# Patient Record
Sex: Male | Born: 1990 | Race: White | Hispanic: No | Marital: Married | Smoking: Former smoker
Health system: Southern US, Community
[De-identification: ages and names within clinical notes are randomized; demographics above are authoritative.]

## PROBLEM LIST (undated history)

## (undated) HISTORY — PX: APPENDECTOMY: SHX54

## (undated) HISTORY — PX: HAND SURGERY: SHX662

---
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
## 2008-11-21 ENCOUNTER — Ambulatory Visit (HOSPITAL_COMMUNITY): Payer: Self-pay | Admitting: Psychiatry

## 2008-12-07 ENCOUNTER — Ambulatory Visit (HOSPITAL_COMMUNITY): Payer: Self-pay | Admitting: Psychiatry

## 2009-10-21 HISTORY — PX: ANTERIOR CRUCIATE LIGAMENT REPAIR: SHX115

## 2010-01-03 ENCOUNTER — Emergency Department (HOSPITAL_COMMUNITY): Admission: EM | Admit: 2010-01-03 | Discharge: 2010-01-03 | Payer: Self-pay | Admitting: Emergency Medicine

## 2010-02-01 ENCOUNTER — Encounter: Admission: RE | Admit: 2010-02-01 | Discharge: 2010-02-15 | Payer: Self-pay | Admitting: Orthopedic Surgery

## 2010-03-05 ENCOUNTER — Emergency Department (HOSPITAL_COMMUNITY): Admission: EM | Admit: 2010-03-05 | Discharge: 2010-03-06 | Payer: Self-pay | Admitting: Emergency Medicine

## 2010-03-26 ENCOUNTER — Encounter: Admission: RE | Admit: 2010-03-26 | Discharge: 2010-05-22 | Payer: Self-pay | Admitting: Orthopedic Surgery

## 2010-05-31 ENCOUNTER — Emergency Department (HOSPITAL_COMMUNITY): Admission: EM | Admit: 2010-05-31 | Discharge: 2010-05-31 | Payer: Self-pay | Admitting: Emergency Medicine

## 2010-06-15 ENCOUNTER — Emergency Department (HOSPITAL_COMMUNITY): Admission: EM | Admit: 2010-06-15 | Discharge: 2010-06-15 | Payer: Self-pay | Admitting: Emergency Medicine

## 2010-07-28 ENCOUNTER — Emergency Department (HOSPITAL_COMMUNITY): Admission: EM | Admit: 2010-07-28 | Discharge: 2010-07-28 | Payer: Self-pay | Admitting: Emergency Medicine

## 2010-08-18 ENCOUNTER — Emergency Department (HOSPITAL_COMMUNITY): Admission: EM | Admit: 2010-08-18 | Discharge: 2010-08-18 | Payer: Self-pay | Admitting: Emergency Medicine

## 2010-12-06 ENCOUNTER — Emergency Department (HOSPITAL_COMMUNITY)
Admission: EM | Admit: 2010-12-06 | Discharge: 2010-12-06 | Disposition: A | Discharge status: Home / Self Care | Payer: Self-pay | Attending: Emergency Medicine | Admitting: Emergency Medicine

## 2010-12-06 DIAGNOSIS — H6690 Otitis media, unspecified, unspecified ear: Secondary | ICD-10-CM | POA: Insufficient documentation

## 2010-12-06 DIAGNOSIS — J45909 Unspecified asthma, uncomplicated: Secondary | ICD-10-CM | POA: Insufficient documentation

## 2010-12-06 DIAGNOSIS — H9209 Otalgia, unspecified ear: Secondary | ICD-10-CM | POA: Insufficient documentation

## 2011-02-20 ENCOUNTER — Emergency Department (HOSPITAL_COMMUNITY)
Admission: EM | Admit: 2011-02-20 | Discharge: 2011-02-20 | Disposition: A | Discharge status: Home / Self Care | Payer: Self-pay | Attending: Emergency Medicine | Admitting: Emergency Medicine

## 2011-02-20 ENCOUNTER — Emergency Department (HOSPITAL_COMMUNITY): Payer: Self-pay

## 2011-02-20 ENCOUNTER — Encounter (HOSPITAL_COMMUNITY): Payer: Self-pay | Admitting: Radiology

## 2011-02-20 DIAGNOSIS — S60229A Contusion of unspecified hand, initial encounter: Secondary | ICD-10-CM | POA: Insufficient documentation

## 2011-02-20 DIAGNOSIS — IMO0002 Reserved for concepts with insufficient information to code with codable children: Secondary | ICD-10-CM | POA: Insufficient documentation

## 2011-02-20 DIAGNOSIS — S20219A Contusion of unspecified front wall of thorax, initial encounter: Secondary | ICD-10-CM | POA: Insufficient documentation

## 2011-02-20 DIAGNOSIS — R109 Unspecified abdominal pain: Secondary | ICD-10-CM | POA: Insufficient documentation

## 2011-02-20 DIAGNOSIS — Y929 Unspecified place or not applicable: Secondary | ICD-10-CM | POA: Insufficient documentation

## 2011-02-20 DIAGNOSIS — J45909 Unspecified asthma, uncomplicated: Secondary | ICD-10-CM | POA: Insufficient documentation

## 2011-02-20 DIAGNOSIS — M79609 Pain in unspecified limb: Secondary | ICD-10-CM | POA: Insufficient documentation

## 2011-02-20 DIAGNOSIS — R079 Chest pain, unspecified: Secondary | ICD-10-CM | POA: Insufficient documentation

## 2011-02-20 DIAGNOSIS — R10812 Left upper quadrant abdominal tenderness: Secondary | ICD-10-CM | POA: Insufficient documentation

## 2011-02-20 MED ORDER — IOHEXOL 300 MG/ML  SOLN
100.0000 mL | Freq: Once | INTRAMUSCULAR | Status: AC | PRN
  Administered 2011-02-20: 100 mL via INTRAVENOUS

## 2011-03-08 NOTE — Op Note (Signed)
NAME:  Billy Duke, Billy Duke NO.:  [id number]   MEDICAL RECORD NO.:  [id number]          PATIENT TYPE:  INP   LOCATION:  1829                         FACILITY:  [facility]   PHYSICIAN:  Prabhakar D. Pendse, M.D.DATE OF BIRTH:  [birth year]   DATE OF PROCEDURE:  07/21/2004  DATE OF DISCHARGE:                                 OPERATIVE REPORT   PREOPERATIVE DIAGNOSIS:  Acute, retrocecal appendicitis.   POSTOPERATIVE DIAGNOSIS:  Acute, retrocecal appendicitis without gross  perforation.   PROCEDURE:  Exploratory laparotomy and appendectomy.   SURGEON:  Prabhakar D. Levie Heritage, M.D.   ASSISTANT:  Nurse.   ANESTHESIA:  General.   INDICATIONS FOR PROCEDURE:  This 20 year old overweight boy was seen at  [facility] with about a 19 hours' history of progressively  worse abdominal pain associated with nausea, anorexia and vomiting.  His  white count was 17,400 with 85% neutrophils.  His urinalysis showed high  proteins and specific gravity of 1.025, microscopic was negative.  He was  referred to [facility] where he underwent CT scan with oral and IV  contrast, which showed findings consistent with acute appendicitis.  Exploratory laparotomy was recommended.   OPERATIVE FINDINGS:  Exploration of the right lower quadrant area showed at  least 1-1/2 inch thickness of the fatty abdominal wall and hence, the  exposure of the cecum and appendix was difficult.  Upon opening the  peritoneal cavity, there was a moderate area of straw colored, clear fluid  without any odor. Appendix was in the retrocecal position, measuring about 3  inches long, markedly indurated, distended without any gross perforation.   During the digital exploration and to separate the appendix from the  retrocecal position, there may have been some contamination.  Limited  examination of the distal ileum showed no evidence of ileitis.  Further  exploration was deferred.   DESCRIPTION OF  PROCEDURE:  Under satisfactory general endotracheal  anesthesia, patient in supine position, abdomen was prepped and draped in  the usual manner.  About a 2 inch long transverse incision was made high in  the right lower quadrant area, skin and subcutaneous tissue incised.  Moderately thick, subcutaneous fatty tissue was encountered. Muscles incised  in the McBurney fashion.   Peritoneal cavity entered, deep retractors were placed in and exploration  revealed findings as described above. The appendiceal base was identified  and held with Babcock forceps.  From here on, most of the dissection was  done digitally, to separate the appendix from the retrocecal position. There  was a moderate amount of bleeding. Appendix was exteriorized with  difficulty.  Appendiceal mesentery clamp and hemoclips applied.  Appendiceal  base was also clamped with about 4 hemoclips, appendectomy done.  The stump  was not buried in the cecal wall, hemostasis accomplished.  Sponge and  needle counts being correct, peritoneum was closed with 2-0 Vicryl running,  interlocking sutures, individual muscles with 2-0 Vicryl interrupted  sutures, subcutaneous tissue with 2-0 Vicryl, skin with 4-0 Monocryl  subcuticular sutures. Steri-Strips applied, appropriate dressing applied.   Throughout the procedure, the patient's vital signs  remained stable. The  patient withstood the procedure well, was transferred to the recovery room  in general, satisfactory condition.       PDP/MEDQ  D:  07/21/2004  T:  07/22/2004  Job:  [id number]   cc:   Katrinka Blazing, Dr.  [facility]   Celene Kras, MD  Fax: [phone]   Maryellen Pile, M.D.

## 2011-04-02 ENCOUNTER — Emergency Department (HOSPITAL_COMMUNITY)
Admission: EM | Admit: 2011-04-02 | Discharge: 2011-04-02 | Disposition: A | Discharge status: Home / Self Care | Payer: Self-pay | Attending: Emergency Medicine | Admitting: Emergency Medicine

## 2011-04-02 DIAGNOSIS — Z0389 Encounter for observation for other suspected diseases and conditions ruled out: Secondary | ICD-10-CM | POA: Insufficient documentation

## 2011-05-06 ENCOUNTER — Emergency Department (HOSPITAL_COMMUNITY)
Admission: EM | Admit: 2011-05-06 | Discharge: 2011-05-06 | Disposition: A | Discharge status: Home / Self Care | Payer: Self-pay | Attending: Emergency Medicine | Admitting: Emergency Medicine

## 2011-05-06 ENCOUNTER — Emergency Department (HOSPITAL_COMMUNITY): Payer: Self-pay

## 2011-05-06 DIAGNOSIS — M25569 Pain in unspecified knee: Secondary | ICD-10-CM | POA: Insufficient documentation

## 2011-05-06 DIAGNOSIS — J45909 Unspecified asthma, uncomplicated: Secondary | ICD-10-CM | POA: Insufficient documentation

## 2011-05-06 DIAGNOSIS — Y9229 Other specified public building as the place of occurrence of the external cause: Secondary | ICD-10-CM | POA: Insufficient documentation

## 2011-05-08 ENCOUNTER — Emergency Department (HOSPITAL_COMMUNITY)
Admission: EM | Admit: 2011-05-08 | Discharge: 2011-05-08 | Disposition: A | Discharge status: Home / Self Care | Payer: Self-pay | Attending: Emergency Medicine | Admitting: Emergency Medicine

## 2011-05-08 DIAGNOSIS — M25569 Pain in unspecified knee: Secondary | ICD-10-CM | POA: Insufficient documentation

## 2011-05-08 DIAGNOSIS — M2390 Unspecified internal derangement of unspecified knee: Secondary | ICD-10-CM | POA: Insufficient documentation

## 2011-05-13 ENCOUNTER — Emergency Department (HOSPITAL_COMMUNITY): Payer: Self-pay

## 2011-05-13 ENCOUNTER — Emergency Department (HOSPITAL_COMMUNITY)
Admission: EM | Admit: 2011-05-13 | Discharge: 2011-05-13 | Disposition: A | Discharge status: Home / Self Care | Payer: Self-pay | Attending: Emergency Medicine | Admitting: Emergency Medicine

## 2011-05-13 ENCOUNTER — Encounter (HOSPITAL_COMMUNITY): Payer: Self-pay

## 2011-05-13 DIAGNOSIS — M79609 Pain in unspecified limb: Secondary | ICD-10-CM | POA: Insufficient documentation

## 2011-05-13 DIAGNOSIS — S61409A Unspecified open wound of unspecified hand, initial encounter: Secondary | ICD-10-CM | POA: Insufficient documentation

## 2011-08-03 ENCOUNTER — Emergency Department (HOSPITAL_COMMUNITY)
Admission: EM | Admit: 2011-08-03 | Discharge: 2011-08-03 | Disposition: A | Discharge status: Home / Self Care | Payer: Self-pay | Attending: Emergency Medicine | Admitting: Emergency Medicine

## 2011-08-03 DIAGNOSIS — L02419 Cutaneous abscess of limb, unspecified: Secondary | ICD-10-CM | POA: Insufficient documentation

## 2011-08-03 DIAGNOSIS — L03119 Cellulitis of unspecified part of limb: Secondary | ICD-10-CM | POA: Insufficient documentation

## 2011-09-27 ENCOUNTER — Emergency Department (HOSPITAL_COMMUNITY)
Admission: EM | Admit: 2011-09-27 | Discharge: 2011-09-27 | Disposition: A | Discharge status: Home / Self Care | Payer: Self-pay | Attending: Emergency Medicine | Admitting: Emergency Medicine

## 2011-09-27 ENCOUNTER — Encounter (HOSPITAL_COMMUNITY): Payer: Self-pay | Admitting: Emergency Medicine

## 2011-09-27 DIAGNOSIS — S81812A Laceration without foreign body, left lower leg, initial encounter: Secondary | ICD-10-CM

## 2011-09-27 DIAGNOSIS — S81009A Unspecified open wound, unspecified knee, initial encounter: Secondary | ICD-10-CM | POA: Insufficient documentation

## 2011-09-27 MED ORDER — AMOXICILLIN-POT CLAVULANATE 875-125 MG PO TABS: 1.0000 | ORAL_TABLET | Freq: Two times a day (BID) | ORAL | Status: AC

## 2011-09-27 MED ORDER — LIDOCAINE HCL 2 % IJ SOLN
INTRAMUSCULAR | Status: AC
  Filled 2011-09-27: qty 1

## 2011-09-27 MED ORDER — AMOXICILLIN-POT CLAVULANATE 875-125 MG PO TABS: 1.0000 | ORAL_TABLET | Freq: Two times a day (BID) | ORAL | Status: DC

## 2011-09-27 NOTE — ED Provider Notes (Signed)
Medical screening examination/treatment/procedure(s) were performed by non-physician practitioner and as supervising physician I was immediately available for consultation/collaboration.  Jasmine Awe, MD 09/27/11 2356

## 2011-09-27 NOTE — ED Notes (Signed)
PA at bedside suturing pt's leg.

## 2011-09-27 NOTE — ED Notes (Signed)
Pt alert, nad, c/o laceration to LLE, onset this am, pt unsure of cause, 3cm superficial laceration noted, bleeding stopped
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
## 2012-02-13 ENCOUNTER — Encounter (HOSPITAL_COMMUNITY): Payer: Self-pay | Admitting: Emergency Medicine

## 2012-02-13 ENCOUNTER — Emergency Department (HOSPITAL_COMMUNITY): Payer: Self-pay

## 2012-02-13 ENCOUNTER — Emergency Department (HOSPITAL_COMMUNITY)
Admission: EM | Admit: 2012-02-13 | Discharge: 2012-02-13 | Disposition: A | Discharge status: Home / Self Care | Payer: Self-pay | Attending: Emergency Medicine | Admitting: Emergency Medicine

## 2012-02-13 DIAGNOSIS — S92309A Fracture of unspecified metatarsal bone(s), unspecified foot, initial encounter for closed fracture: Secondary | ICD-10-CM | POA: Insufficient documentation

## 2012-02-13 DIAGNOSIS — S92323A Displaced fracture of second metatarsal bone, unspecified foot, initial encounter for closed fracture: Secondary | ICD-10-CM

## 2012-02-13 DIAGNOSIS — F17200 Nicotine dependence, unspecified, uncomplicated: Secondary | ICD-10-CM | POA: Insufficient documentation

## 2012-02-13 DIAGNOSIS — IMO0002 Reserved for concepts with insufficient information to code with codable children: Secondary | ICD-10-CM | POA: Insufficient documentation

## 2012-02-13 DIAGNOSIS — M79609 Pain in unspecified limb: Secondary | ICD-10-CM | POA: Insufficient documentation

## 2012-02-13 DIAGNOSIS — J45909 Unspecified asthma, uncomplicated: Secondary | ICD-10-CM | POA: Insufficient documentation

## 2012-02-13 MED ORDER — OXYCODONE-ACETAMINOPHEN 5-325 MG PO TABS: 1.0000 | ORAL_TABLET | ORAL | Status: AC | PRN

## 2012-02-13 MED ORDER — DICLOFENAC SODIUM 75 MG PO TBEC: 75.0000 mg | DELAYED_RELEASE_TABLET | Freq: Two times a day (BID) | ORAL | Status: AC

## 2012-02-13 MED ORDER — KETOROLAC TROMETHAMINE 60 MG/2ML IM SOLN
60.0000 mg | Freq: Once | INTRAMUSCULAR | Status: AC
  Administered 2012-02-13: 60 mg via INTRAMUSCULAR
  Filled 2012-02-13: qty 2

## 2012-02-13 MED ORDER — TRAMADOL HCL 50 MG PO TABS: 50.0000 mg | ORAL_TABLET | Freq: Four times a day (QID) | ORAL | Status: AC | PRN

## 2012-02-13 NOTE — ED Provider Notes (Signed)
Medical screening examination/treatment/procedure(s) were performed by non-physician practitioner and as supervising physician I was immediately available for consultation/collaboration.  Ethelda Chick, MD 02/13/12 [phone]

## 2012-02-13 NOTE — ED Notes (Signed)
Per pt, dropped a TV on L foot last night and thinks he fx it; pt has abrasion and swelling to L foot; pt has strong DP, CMS intact; pt took ibuprofen for pain, last time was last night; pt resp e/u, pt in NAD

## 2012-02-13 NOTE — Progress Notes (Signed)
Orthopedic Tech Progress Note Patient Details:  Billy Duke 11/29/1990 [id number]  Other Ortho Devices Type of Ortho Device: Crutches Ortho Device Interventions: Ordered  Type of Splint: Short Leg Splint Location: (L) LE Splint Interventions: Application    Jennye Moccasin 02/13/2012, 5:33 PM

## 2012-02-13 NOTE — ED Provider Notes (Signed)
History     CSN: [id number]  Arrival date & time 02/13/12  1530   First MD Initiated Contact with Patient 02/13/12 1654    5:08 PM HPI Patient reports he was lifting a heavy TV when it fell onto his left foot. Reports severe pain in his left foot at the second MTP. Reports swelling, bruising, abrasions. States this occurred last night.   Patient is a 21 y.o. male presenting with foot injury.  Foot Injury  The incident occurred yesterday. The incident occurred at home. The pain is present in the left foot. The pain is moderate. The pain has been constant since onset. Associated symptoms include inability to bear weight. Pertinent negatives include no numbness, no loss of motion, no muscle weakness, no loss of sensation and no tingling. He reports no foreign bodies present. The symptoms are aggravated by bearing weight, activity and palpation. He has tried NSAIDs for the symptoms. The treatment provided no relief.    Past Medical History  Diagnosis Date  . Asthma     Past Surgical History  Procedure Date  . Appendectomy   . Anterior cruciate ligament repair 2011    L leg  . Hand surgery     L hand    History reviewed. No pertinent family history.  History  Substance Use Topics  . Smoking status: Current Everyday Smoker -- 0.5 packs/day  . Smokeless tobacco: Not on file  . Alcohol Use: Yes     occasion      Review of Systems  Musculoskeletal:       Foot pain and swelling  Neurological: Negative for tingling and numbness.  All other systems reviewed and are negative.    Allergies  Review of patient's allergies indicates no known allergies.  Home Medications   Current Outpatient Rx  Name Route Sig Dispense Refill  . IBUPROFEN 200 MG PO TABS Oral Take 800 mg by mouth once.      BP 132/68  Pulse 80  Temp(Src) 98.1 F (36.7 C) (Oral)  Resp 18  SpO2 98%  Physical Exam  Constitutional: He is oriented to person, place, and time. He appears well-developed and  well-nourished.  HENT:  Head: Normocephalic and atraumatic.  Eyes: Pupils are equal, round, and reactive to light.  Musculoskeletal:       Left foot: He exhibits decreased range of motion, tenderness, bony tenderness and swelling. He exhibits normal capillary refill, no crepitus and no deformity.       Feet:  Neurological: He is alert and oriented to person, place, and time.  Skin: Skin is warm and dry. No rash noted. No erythema. No pallor.  Psychiatric: He has a normal mood and affect. His behavior is normal.    ED Course  Procedures  Dg Foot Complete Left  02/13/2012  *RADIOLOGY REPORT*  Clinical Data: Blow to the foot.  Pain.  LEFT FOOT - COMPLETE 3+ VIEW  Comparison: None.  Findings: There is a nondisplaced fracture through the distal diaphysis of the second metatarsal.  No other acute bony or joint abnormality is identified.  IMPRESSION: Nondisplaced fractures diaphysis of the second metatarsal.  Original Report Authenticated By: Bernadene Bell. D'ALESSIO, M.D.     MDM    Patient placed in a posterior splint and crutches. Patient reports improvement in pain after medication. Neurovascularly intact.. Will discharge him with referral to Dr. handy, orthopedic physician, as well as analgesics. Patient agrees with plan and is ready for discharge      Thomasene Lot,  PA-C 02/13/12 1759

## 2012-02-13 NOTE — Discharge Instructions (Signed)
Foot Fracture Your caregiver has diagnosed you as having a foot fracture (broken bone). Your foot has many bones. You have a fracture, or break, in one of these bones. In some cases, your doctor may put on a splint or removable fracture boot until the swelling in your foot has lessened. A cast may or may not be required. HOME CARE INSTRUCTIONS  If you do not have a cast or splint:  You may bear weight on your injured foot as tolerated or advised.   Do not put any weight on your injured foot for as long as directed by your caregiver. Slowly increase the amount of time you walk on the foot as the pain and swelling allows or as advised.   Use crutches until you can bear weight without pain. A gradual increase in weight bearing may help.   Apply ice to the injury for 15 to 20 minutes each hour while awake for the first 2 days. Put the ice in a plastic bag and place a towel between the bag of ice and your skin.   If an ace bandage (stretchy, elastic wrapping bandage) was applied, you may re-wrap it if ankle is more painful or your toes become cold and swollen.  If you have a cast or splint:  Use your crutches for as long as directed by your caregiver.   To lessen the swelling, keep the injured foot elevated on pillows while lying down or sitting. Elevate your foot above your heart.   Apply ice to the injury for 15 to 20 minutes each hour while awake for the first 2 days. Put the ice in a plastic bag and place a thin towel between the bag of ice and your cast.   Plaster or fiberglass cast:   Do not try to scratch the skin under the cast using a sharp or pointed object down the cast.   Check the skin around the cast every day. You may put lotion on any red or sore areas.   Keep your cast clean and dry.   Plaster splint:   Wear the splint until you are seen for a follow-up examination.   You may loosen the elastic around the splint if your toes become numb, tingle, or turn blue or cold. Do  not rest it on anything harder than a pillow in the first 24 hours.   Do not put pressure on any part of your splint. Use your crutches as directed.   Keep your splint dry. It can be protected during bathing with a plastic bag. Do not lower the splint into water.   If you have a fracture boot you may remove it to shower. Bear weight only as instructed by your caregiver.   Only take over-the-counter or prescription medicines for pain, discomfort, or fever as directed by your caregiver.  SEEK IMMEDIATE MEDICAL CARE IF:   Your cast gets damaged or breaks.   You have continued severe pain or more swelling than you did before the cast was put on.   Your skin or nails of your casted foot turn blue, gray, feel cold or numb.   There is a bad smell from your cast.   There is severe pain with movement of your toes.   There are new stains and/or drainage coming from under the cast.  MAKE SURE YOU:   Understand these instructions.   Will watch your condition.   Will get help right away if you are not doing well or get   worse.  Document Released: 10/04/2000 Document Revised: 09/26/2011 Document Reviewed: 11/10/2008 [employer] Patient Information 2012 [employer]. 

## 2012-12-09 IMAGING — CR DG HAND COMPLETE 3+V*R*
3 series · 3 of 3 positions shown · non-contrast
Comparison: 02/20/2011

CLINICAL DATA: Trauma to fifth digit

RIGHT HAND - COMPLETE 3+ VIEW

[x hand ap right]
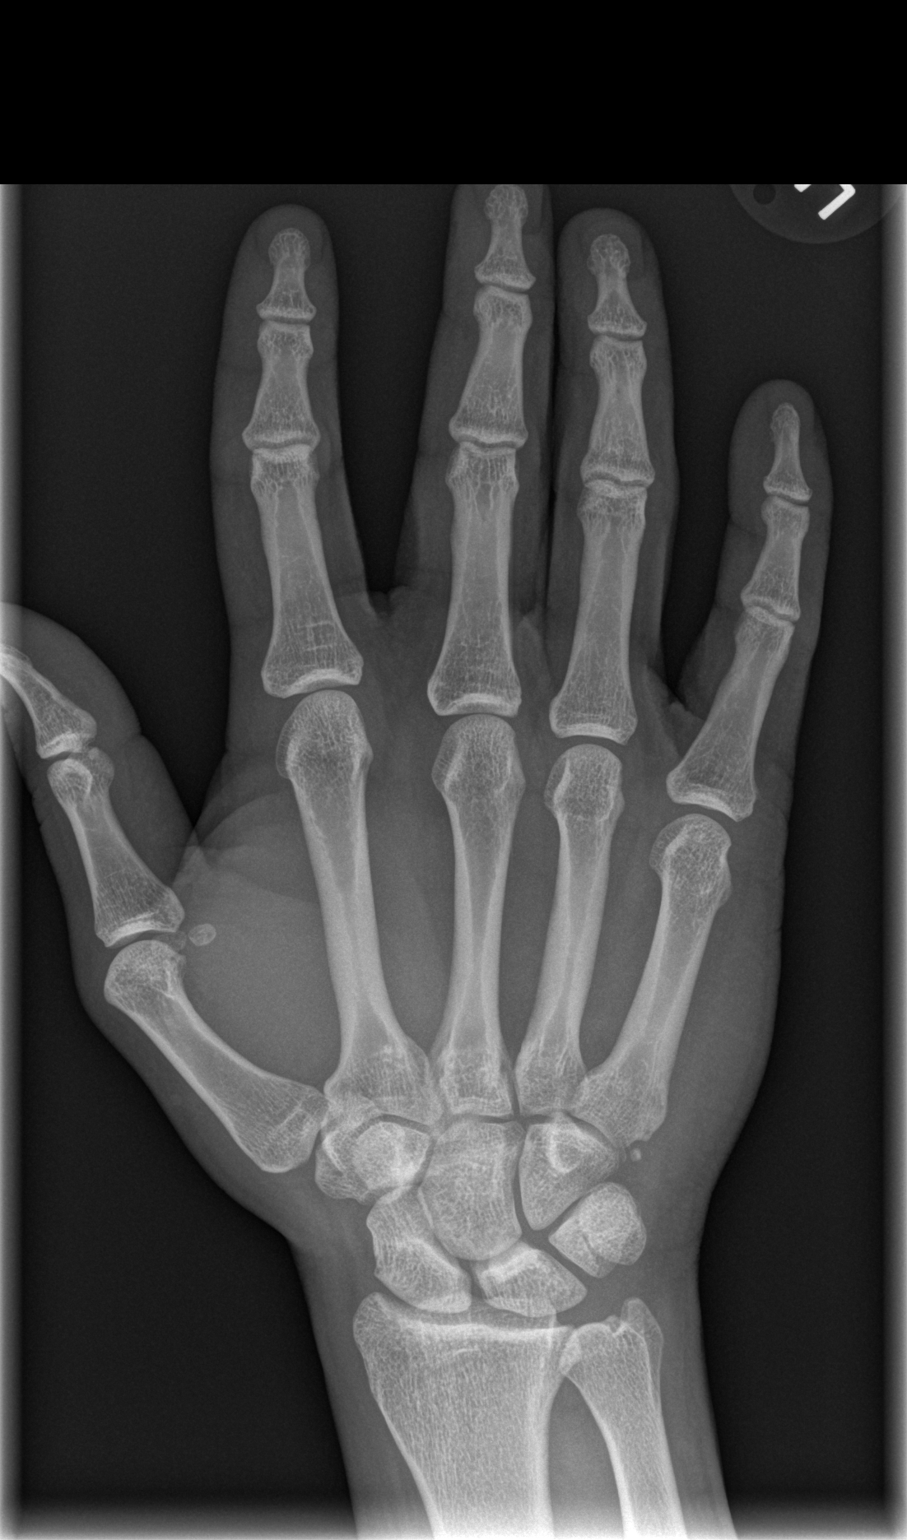

[x hand oblique right]
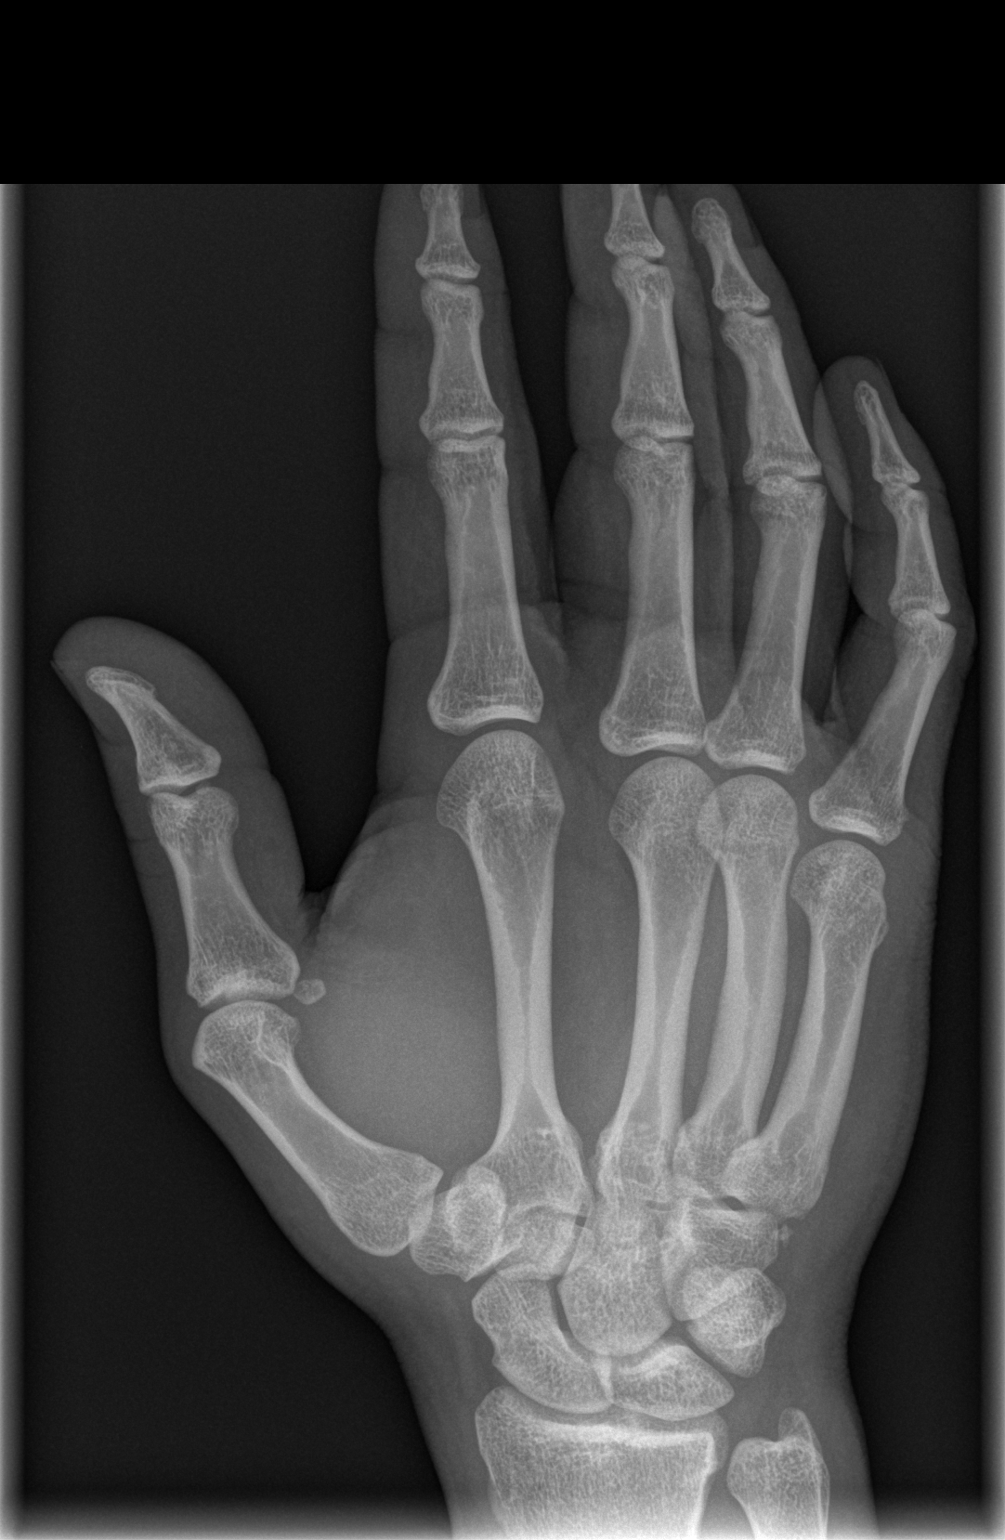

[x hand lat right]
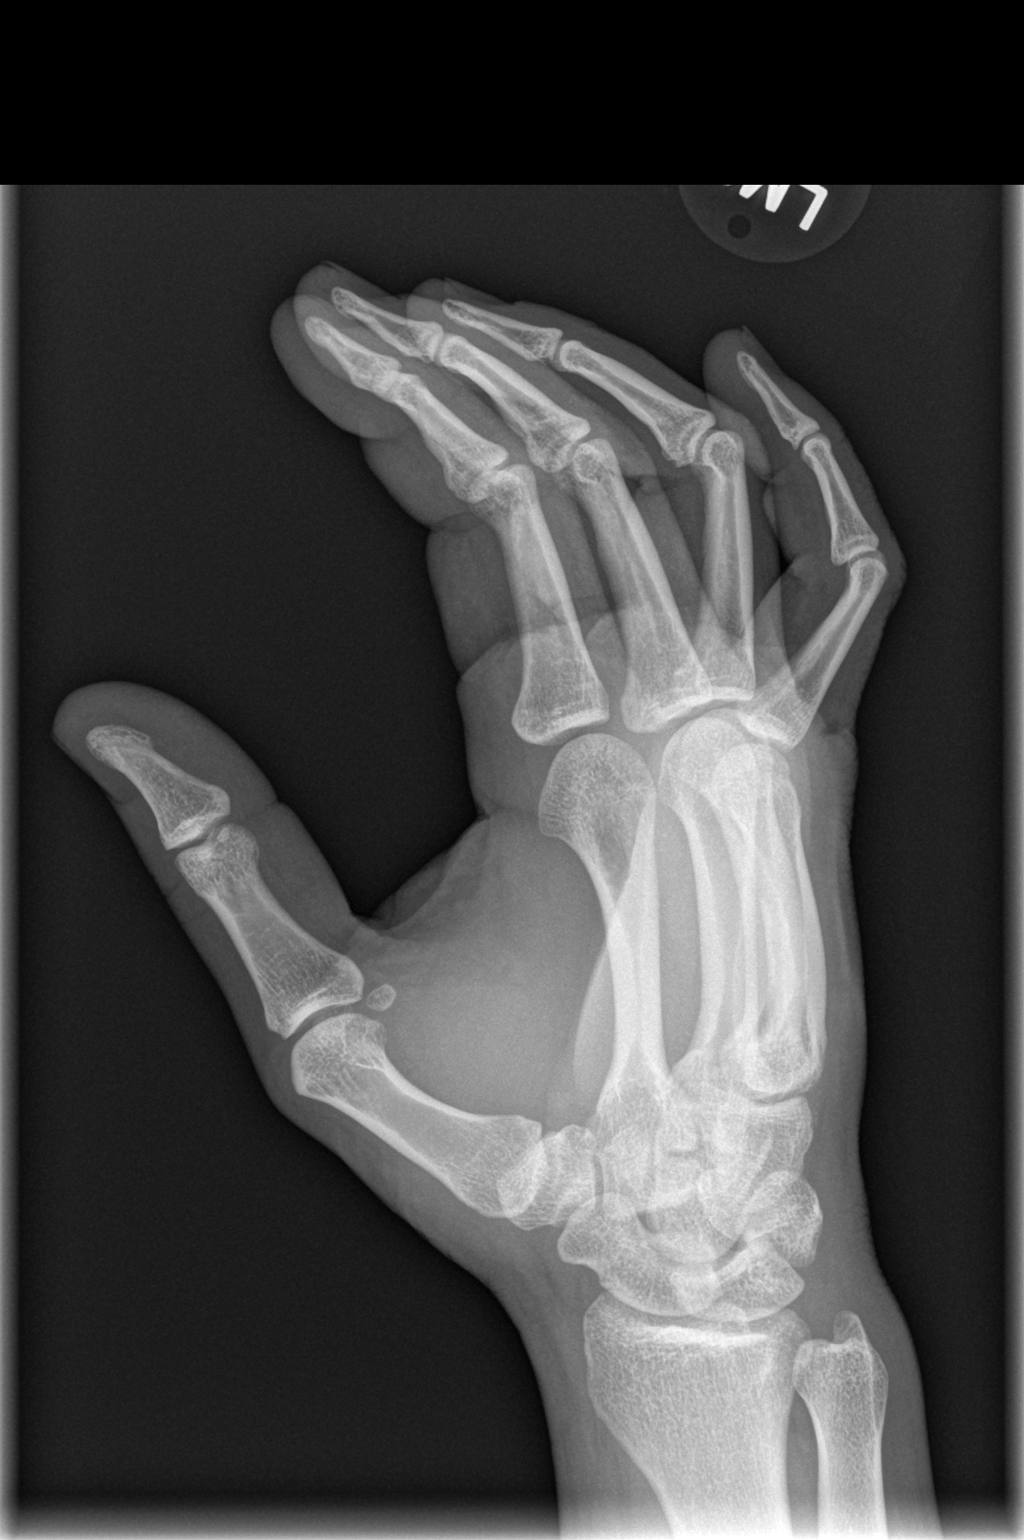

[3 of 3 positions shown; findings below may reference images not displayed]

FINDINGS: Stable non fused apophysis versus remote trauma to the
base of the fifth metacarpal bone. No displaced acute fracture or
dislocation identified. No aggressive appearing osseous lesion.  No
radiopaque foreign body.
IMPRESSION: No acute osseous abnormality.

## 2013-12-23 ENCOUNTER — Ambulatory Visit: Payer: PRIVATE HEALTH INSURANCE

## 2013-12-23 ENCOUNTER — Ambulatory Visit (INDEPENDENT_AMBULATORY_CARE_PROVIDER_SITE_OTHER): Payer: PRIVATE HEALTH INSURANCE | Admitting: Family Medicine

## 2013-12-23 VITALS — BP 132/78 | HR 67 | Temp 98.7°F | Resp 18 | Ht 70.0 in | Wt 226.0 lb

## 2013-12-23 DIAGNOSIS — S335XXA Sprain of ligaments of lumbar spine, initial encounter: Secondary | ICD-10-CM

## 2013-12-23 DIAGNOSIS — M545 Low back pain, unspecified: Secondary | ICD-10-CM

## 2013-12-23 DIAGNOSIS — S39012A Strain of muscle, fascia and tendon of lower back, initial encounter: Secondary | ICD-10-CM

## 2013-12-23 MED ORDER — HYDROCODONE-ACETAMINOPHEN 5-325 MG PO TABS: 1.0000 | ORAL_TABLET | Freq: Four times a day (QID) | ORAL | Status: DC | PRN

## 2013-12-23 MED ORDER — METHOCARBAMOL 750 MG PO TABS: 750.0000 mg | ORAL_TABLET | Freq: Four times a day (QID) | ORAL | Status: DC | PRN

## 2013-12-23 NOTE — Patient Instructions (Signed)

## 2013-12-23 NOTE — Progress Notes (Signed)
Subjective:    Patient ID: Billy Duke, male    DOB: 11/20/1990, 23 y.o.   MRN: [id number]  HPI This chart was scribed for Billy Duke by [name], Scribe. This patient was seen in room 2 and the patient's care was started at 2:47 PM.  HPI Comments: Billy PriestWilliam W Duke is a 23 y.o. male who presents to the [facility] complaining of constant lower left back pain that started about 2 weeks ago.  Pt states at work last night he picked up a heavy trash can and the pain worsened.  Pt states he heard a pop last night when he picked up the trash can.  Pt reports the pain radiates into his left hip.  Pt rates his pain a 8 out of 10.  Pt denies tingling and numbness in his lower extremities and in his private area.  Pt denies bowel and bladder incontinence.  Pt states he took ibuprofen without relief.   Pt denies having a PCP.  Pt denies taking any daily medications.  Pt works 5 days a week at [employer].    Past Medical History  Diagnosis Date  . Asthma     Past Surgical History  Procedure Laterality Date  . Appendectomy    . Anterior cruciate ligament repair  2011    L leg  . Hand surgery      L hand    History reviewed. No pertinent family history.  History   Social History  . Marital Status: Married    Spouse Name: N/A    Number of Children: N/A  . Years of Education: N/A   Occupational History  . Not on file.   Social History Main Topics  . Smoking status: Current Every Day Smoker -- 0.50 packs/day  . Smokeless tobacco: Not on file  . Alcohol Use: Yes     Comment: occasion  . Drug Use: No  . Sexual Activity:    Other Topics Concern  . Not on file   Social History Narrative  . No narrative on file    No Known Allergies  There are no active problems to display for this patient.   No results found for this or any previous visit.  Review of Systems  Constitutional: Negative for fever and chills.  Respiratory: Negative for cough  and shortness of breath.   Gastrointestinal: Negative for nausea, vomiting, abdominal pain and diarrhea.  Musculoskeletal: Positive for back pain. Negative for neck pain.  Skin: Negative for color change and rash.  Neurological: Negative for headaches.  Psychiatric/Behavioral: Negative for behavioral problems and confusion.       Objective:   Physical Exam  Nursing note and vitals reviewed. Constitutional: He is oriented to person, place, and time. He appears well-developed and well-nourished. No distress.  HENT:  Head: Normocephalic and atraumatic.  Eyes: EOM are normal.  Cardiovascular: Normal rate.   Pulmonary/Chest: Effort normal. No respiratory distress.  Musculoskeletal: Normal range of motion.  Tender to palpation left paraspinal lumbar region.  Straight leg raises were positive.  Pain with flexion greater than extension.  Pain with lateral bending sided to side.  Toe and heel walking intact.  Marching intact.  Motor strength 5 out of 5.    Neurological: He is alert and oriented to person, place, and time.  Skin: Skin is warm and dry. No rash noted.  Psychiatric: He has a normal mood and affect. His behavior is normal.     Filed  Vitals:   12/23/13 1350  BP: 132/78  Pulse: 67  Temp: 98.7 F (37.1 C)  TempSrc: Oral  Resp: 18  Height: 5\' 10"  (1.778 m)  Weight: 226 lb (102.513 kg)  SpO2: 98%    [facility] reading (PRIMARY) by  Dr. Katrinka Blazing. LUMBAR FILMS: NAD      Assessment & Plan:  Low back pain - Plan: DG Lumbar Spine Complete  Strain of lumbar paraspinal muscle   1. Low back pain/lumbar sprain: New. Rx for Robaxin 750mg  qid, Hydrocodone 5/325 qid; continue Ibuprofen 600mg  qid as well.  Recommend rest, stretches, frequent ambulation,heat to area.  Home exercise program provided to start in 72 hours.  Avoid repetitive bending, twisting, rotating for two weeks.  If no improvement in 2-3 weeks, call for PT and/or ortho referral.   Meds ordered this encounter  Medications   . methocarbamol (ROBAXIN) 750 MG tablet    Sig: Take 1 tablet (750 mg total) by mouth every 6 (six) hours as needed for muscle spasms.    Dispense:  40 tablet    Refill:  0  . HYDROcodone-acetaminophen (NORCO/VICODIN) 5-325 MG per tablet    Sig: Take 1 tablet by mouth every 6 (six) hours as needed for moderate pain.    Dispense:  30 tablet    Refill:  0    I personally performed the services described in this documentation, which was scribed in my presence.  The recorded information has been reviewed and is accurate.  Nilda Simmer, M.D.  [facility] [address] [place], [place]  [address] [phone] phone [phone] fax

## 2014-06-26 ENCOUNTER — Encounter (HOSPITAL_COMMUNITY): Payer: Self-pay | Admitting: Emergency Medicine

## 2014-06-26 ENCOUNTER — Emergency Department (HOSPITAL_COMMUNITY)
Admission: EM | Admit: 2014-06-26 | Discharge: 2014-06-27 | Disposition: A | Discharge status: Home / Self Care | Payer: No Typology Code available for payment source | Attending: Emergency Medicine | Admitting: Emergency Medicine

## 2014-06-26 ENCOUNTER — Emergency Department (HOSPITAL_COMMUNITY): Payer: No Typology Code available for payment source

## 2014-06-26 DIAGNOSIS — IMO0001 Reserved for inherently not codable concepts without codable children: Secondary | ICD-10-CM

## 2014-06-26 DIAGNOSIS — W108XXA Fall (on) (from) other stairs and steps, initial encounter: Secondary | ICD-10-CM | POA: Diagnosis not present

## 2014-06-26 DIAGNOSIS — Y929 Unspecified place or not applicable: Secondary | ICD-10-CM | POA: Diagnosis not present

## 2014-06-26 DIAGNOSIS — F17200 Nicotine dependence, unspecified, uncomplicated: Secondary | ICD-10-CM | POA: Insufficient documentation

## 2014-06-26 DIAGNOSIS — Y9389 Activity, other specified: Secondary | ICD-10-CM | POA: Insufficient documentation

## 2014-06-26 DIAGNOSIS — J45909 Unspecified asthma, uncomplicated: Secondary | ICD-10-CM | POA: Diagnosis not present

## 2014-06-26 DIAGNOSIS — S63269A Dislocation of metacarpophalangeal joint of unspecified finger, initial encounter: Secondary | ICD-10-CM | POA: Diagnosis not present

## 2014-06-26 DIAGNOSIS — S6990XA Unspecified injury of unspecified wrist, hand and finger(s), initial encounter: Secondary | ICD-10-CM | POA: Diagnosis present

## 2014-06-26 DIAGNOSIS — S63051A Subluxation of other carpometacarpal joint of right hand, initial encounter: Secondary | ICD-10-CM

## 2014-06-26 MED ORDER — OXYCODONE-ACETAMINOPHEN 5-325 MG PO TABS
2.0000 | ORAL_TABLET | Freq: Once | ORAL | Status: AC
  Administered 2014-06-26: 2 via ORAL
  Filled 2014-06-26: qty 2

## 2014-06-26 MED ORDER — OXYCODONE-ACETAMINOPHEN 5-325 MG PO TABS: 1.0000 | ORAL_TABLET | Freq: Four times a day (QID) | ORAL | Status: DC | PRN

## 2014-06-26 NOTE — ED Notes (Addendum)
Pt arrived to day to the ED with a complaint of right hand pain.  Pt states that he was carrying groceries and fell down three steps and hit his hand on cement.  Pt has obvious deformity to hand. Cap refill <3 sec. Pt is having radiating pain in his elbow

## 2014-06-26 NOTE — ED Provider Notes (Signed)
Medical screening examination/treatment/procedure(s) were performed by non-physician practitioner and as supervising physician I was immediately available for consultation/collaboration.   EKG Interpretation None      Devoria Albe, MD, Armando Gang   Ward Givens, MD 06/26/14 [phone]

## 2014-06-26 NOTE — Discharge Instructions (Signed)
You were seen and evaluated for your right hand injury. You were found to have a dislocation of the bones and these were put back into place. Use rest, ice, compression and elevation to reduce pain and swelling. Followup with an orthopedic specialist for continued evaluation and treatment.   Closed Reduction for Metacarpal Fracture or Dislocation, Care After Refer to this sheet in the next few weeks. These instructions provide you with information on caring for yourself after your procedure. Your health care provider may also give you more specific instructions. Your treatment has been planned according to current medical practices, but problems sometimes occur. Call your health care provider if you have any problems or questions after your procedure. HOME CARE INSTRUCTIONS   Only take medicines as directed by your health care provider.  Keep the injured area elevated above the level of your heart while you are sleeping or sitting down.  Apply ice to the injured area, twice per day, for 2-3 days:  Put ice in a plastic bag.  Place a towel between your skin and the bag.  Leave the ice on for 15 minutes.  Keep your splint clean and dry. Cover your splint with a bag while you shower. SEEK MEDICAL CARE IF:  Your pain does not get better or gets worse.  You develop numbness or tingling.  Your fingers turn white or bluish. Document Released: 07/01/2012 Document Revised: 02/21/2014 Document Reviewed: 07/01/2012 [facility] Patient Information 2015 [place], [place]. This information is not intended to replace advice given to you by your health care provider. Make sure you discuss any questions you have with your health care provider.

## 2014-06-26 NOTE — ED Provider Notes (Signed)
CSN: [id number]     Arrival date & time 06/26/14  1959 History   This chart was scribed for Ivonne Andrew, PA-C working with Ward Givens, MD by Evon Slack, ED Scribe. This patient was seen in room WTR5/WTR5 and the patient's care was started at 9:24 PM.    Chief Complaint  Patient presents with  . Hand Pain   Patient is a 23 y.o. male presenting with hand pain. The history is provided by the patient. No language interpreter was used.  Hand Pain   HPI Comments: Billy Duke is a 23 y.o. male who presents to the Emergency Department complaining of right hand pain onset prior to arrival. He states he has pain with associated numbness in 4th and 5th digit.  He states that he was carrying groceries and tripped and fell down on some stairs onto his hand. He believes his hand may have been closed in a fist during the injury. He denies any head injury or LOC. No other complaints.  Past Medical History  Diagnosis Date  . Asthma    Past Surgical History  Procedure Laterality Date  . Appendectomy    . Anterior cruciate ligament repair  2011    L leg  . Hand surgery      L hand   History reviewed. No pertinent family history. History  Substance Use Topics  . Smoking status: Current Every Day Smoker -- 0.50 packs/day  . Smokeless tobacco: Not on file  . Alcohol Use: Yes     Comment: occasion    Review of Systems  Musculoskeletal: Positive for arthralgias.  Neurological: Positive for numbness.   Allergies  Review of patient's allergies indicates no known allergies.  Home Medications   Prior to Admission medications   Medication Sig Start Date End Date Taking? Authorizing Provider  HYDROcodone-acetaminophen (NORCO/VICODIN) 5-325 MG per tablet Take 1 tablet by mouth every 6 (six) hours as needed for moderate pain. 12/23/13   Ethelda Chick, MD  ibuprofen (ADVIL,MOTRIN) 200 MG tablet Take 800 mg by mouth once.    Historical Provider, MD  methocarbamol (ROBAXIN) 750 MG tablet Take 1  tablet (750 mg total) by mouth every 6 (six) hours as needed for muscle spasms. 12/23/13   Ethelda Chick, MD   Triage Vitals: BP 166/103  Pulse 91  Temp(Src) 98.2 F (36.8 C) (Oral)  Resp 22  Ht  (1.753 m)  Wt 225 lb (102.059 kg)  BMI 33.21 kg/m2  SpO2 100%  Physical Exam  Nursing note and vitals reviewed. Constitutional: He is oriented to person, place, and time. He appears well-developed and well-nourished. No distress.  HENT:  Head: Normocephalic and atraumatic.  Eyes: Conjunctivae and EOM are normal.  Neck: Neck supple. No tracheal deviation present.  Cardiovascular: Normal rate.   Pulmonary/Chest: Effort normal. No respiratory distress.  Musculoskeletal: Normal range of motion.  Deformity over the right fourth and fifth proximal metacarpal bone area. Pain to palpation. Slightly reduced range of motion of the fourth and fifth digits secondary to pain and swelling. Normal distal sensations and capillary refill less than 2 seconds.  Neurological: He is alert and oriented to person, place, and time.  Skin: Skin is warm and dry.  Psychiatric: He has a normal mood and affect. His behavior is normal.    ED Course  ORTHOPEDIC INJURY TREATMENT Date/Time: 06/26/2014 10:30 PM Performed by: Angus Seller Authorized by: Ivonne Andrew S Risks and benefits: risks, benefits and alternatives were discussed Consent given  by: patient Patient understanding: patient states understanding of the procedure being performed Patient consent: the patient's understanding of the procedure matches consent given Procedure consent: procedure consent matches procedure scheduled Patient identity confirmed: verbally with patient Time out: Immediately prior to procedure a "time out" was called to verify the correct patient, procedure, equipment, support staff and site/side marked as required. Injury location: hand Location details: right hand Injury type: dislocation Dislocation type: carpometacarpal  (finger) Pre-procedure neurovascular assessment: neurovascularly intact Pre-procedure distal perfusion: normal Local anesthesia used: no Manipulation performed: yes Reduction successful: yes X-ray confirmed reduction: yes Immobilization: splint Splint type: ulnar gutter Post-procedure neurovascular assessment: post-procedure neurovascularly intact Patient tolerance: Patient tolerated the procedure well with no immediate complications.     DIAGNOSTIC STUDIES: Oxygen Saturation is 100% on RA, normal by my interpretation.    COORDINATION OF CARE: 9:27 PM-Discussed treatment plan which includes right hand x-ray  with pt at bedside and pt agreed to plan.   X-ray demonstrates dislocation of the fourth and fifth metacarpals.  Post reduction x-rays appear well. Small avulsion fracture. Ulnar gutter splint applied and orthopedic hand followup given.  Imaging Review Dg Hand Complete Right  06/26/2014   CLINICAL DATA:  Postreduction  EXAM: RIGHT HAND - COMPLETE 3+ VIEW  COMPARISON:  06/26/2014  FINDINGS: Interval reduction of previous carpometacarpal dislocations with anatomic alignment demonstrated. Dorsal soft tissue swelling is present. Tiny bone fragments demonstrated adjacent to the fifth carpometacarpal joint probably representing avulsion fracture fragments.  IMPRESSION: Anatomic alignment of the carpometacarpal joints is demonstrated. Small avulsion fracture fragments at the fifth carpometacarpal joint.   Electronically Signed   By: Burman Nieves M.D.   On: 06/26/2014 23:21   Dg Hand Complete Right  06/26/2014   CLINICAL DATA:  Larey Seat down stairs. Right hand injury with pain and swelling.  EXAM: RIGHT HAND - COMPLETE 3+ VIEW  COMPARISON:  05/13/2011  FINDINGS: Dorsal dislocations of the bases of the fourth and fifth metacarpals is seen. A few tiny ossific densities are seen, consistent with tiny avulsion fracture fragments.  IMPRESSION: Dorsal dislocations of the bases of the fourth and  fifth metacarpals, with associated tiny avulsion fragments.   Electronically Signed   By: Myles Rosenthal M.D.   On: 06/26/2014 21:23     MDM   Final diagnoses:  Dislocation of metacarpal joint, right, initial encounter   I personally performed the services described in this documentation, which was scribed in my presence. The recorded information has been reviewed and is accurate.    Angus Seller, PA-C 06/26/14 2348

## 2014-06-26 NOTE — ED Notes (Signed)
Pt being splinted

## 2016-01-19 ENCOUNTER — Emergency Department (HOSPITAL_COMMUNITY)
Admission: EM | Admit: 2016-01-19 | Discharge: 2016-01-19 | Disposition: A | Discharge status: Home / Self Care | Payer: No Typology Code available for payment source | Attending: Emergency Medicine | Admitting: Emergency Medicine

## 2016-01-19 ENCOUNTER — Emergency Department (HOSPITAL_COMMUNITY): Payer: No Typology Code available for payment source

## 2016-01-19 ENCOUNTER — Encounter (HOSPITAL_COMMUNITY): Payer: Self-pay

## 2016-01-19 DIAGNOSIS — X58XXXA Exposure to other specified factors, initial encounter: Secondary | ICD-10-CM | POA: Insufficient documentation

## 2016-01-19 DIAGNOSIS — Y998 Other external cause status: Secondary | ICD-10-CM | POA: Insufficient documentation

## 2016-01-19 DIAGNOSIS — F17200 Nicotine dependence, unspecified, uncomplicated: Secondary | ICD-10-CM | POA: Insufficient documentation

## 2016-01-19 DIAGNOSIS — S8992XA Unspecified injury of left lower leg, initial encounter: Secondary | ICD-10-CM | POA: Insufficient documentation

## 2016-01-19 DIAGNOSIS — M25462 Effusion, left knee: Secondary | ICD-10-CM | POA: Insufficient documentation

## 2016-01-19 DIAGNOSIS — M25562 Pain in left knee: Secondary | ICD-10-CM

## 2016-01-19 DIAGNOSIS — J45909 Unspecified asthma, uncomplicated: Secondary | ICD-10-CM | POA: Insufficient documentation

## 2016-01-19 DIAGNOSIS — Y9389 Activity, other specified: Secondary | ICD-10-CM | POA: Insufficient documentation

## 2016-01-19 DIAGNOSIS — Y9289 Other specified places as the place of occurrence of the external cause: Secondary | ICD-10-CM | POA: Insufficient documentation

## 2016-01-19 DIAGNOSIS — Z9889 Other specified postprocedural states: Secondary | ICD-10-CM | POA: Insufficient documentation

## 2016-01-19 MED ORDER — HYDROCODONE-ACETAMINOPHEN 5-325 MG PO TABS: 1.0000 | ORAL_TABLET | ORAL | Status: DC | PRN
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
## 2016-01-19 NOTE — Discharge Instructions (Signed)
Take the prescribed medication as directed for pain. Recommend to ice and elevate knee at home to help with pain/swelling. Follow-up with Dr. Magnus IvanBlackman-- call to make appt. Return to the ED for new or worsening symptoms.

## 2018-02-19 ENCOUNTER — Ambulatory Visit (HOSPITAL_COMMUNITY)
Admission: EM | Admit: 2018-02-19 | Discharge: 2018-02-19 | Disposition: A | Discharge status: Home / Self Care | Payer: 59 | Attending: Family Medicine | Admitting: Family Medicine

## 2018-02-19 ENCOUNTER — Encounter (HOSPITAL_COMMUNITY): Payer: Self-pay | Admitting: Family Medicine

## 2018-02-19 DIAGNOSIS — S61210A Laceration without foreign body of right index finger without damage to nail, initial encounter: Secondary | ICD-10-CM

## 2018-02-19 NOTE — ED Provider Notes (Signed)
[facility]    CSN: [id number] Arrival date & time: 02/19/18  1459     History   Chief Complaint Chief Complaint  Patient presents with  . Laceration    HPI Billy Duke is a 27 y.o. male negative past medical history presenting today for evaluation of laceration to his right index finger.  Lacerations on the distal end of finger.  He cut his finger as he was reaching into a toolbox and cut it on a blade.  States his tetanus shot was in the past 2 to 3 years as he previously had a laceration of his head due to another injury.  Is having slight numbness around the site of the wound.  Has not cleaned it.  Bleeding controlled with direct pressure.  HPI  Past Medical History:  Diagnosis Date  . Asthma     There are no active problems to display for this patient.   Past Surgical History:  Procedure Laterality Date  . ANTERIOR CRUCIATE LIGAMENT REPAIR  2011   L leg  . APPENDECTOMY    . HAND SURGERY     L hand       Home Medications    Prior to Admission medications   Not on File    Family History History reviewed. No pertinent family history.  Social History Social History   Tobacco Use  . Smoking status: Current Every Day Smoker    Packs/day: 0.50  Substance Use Topics  . Alcohol use: Yes    Comment: occasion  . Drug use: No     Allergies   Patient has no known allergies.   Review of Systems Review of Systems  Constitutional: Negative for fatigue and fever.  Respiratory: Negative for shortness of breath.   Gastrointestinal: Negative for nausea and vomiting.  Musculoskeletal: Negative for arthralgias and myalgias.  Skin: Positive for wound. Negative for color change, pallor and rash.  Neurological: Negative for syncope, weakness, light-headedness and headaches.     Physical Exam Triage Vital Signs ED Triage Vitals  Enc Vitals Group     BP 02/19/18 1528 (!) 137/93     Pulse Rate 02/19/18 1528 76     Resp 02/19/18 1528 18   Temp 02/19/18 1528 98.2 F (36.8 C)     Temp src --      SpO2 02/19/18 1528 100 %     Weight --      Height --      Head Circumference --      Peak Flow --      Pain Score 02/19/18 1526 7     Pain Loc --      Pain Edu? --      Excl. in GC? --    No data found.  Updated Vital Signs BP (!) 137/93   Pulse 76   Temp 98.2 F (36.8 C)   Resp 18   SpO2 100%   Visual Acuity Right Eye Distance:   Left Eye Distance:   Bilateral Distance:    Right Eye Near:   Left Eye Near:    Bilateral Near:     Physical Exam  Constitutional: He appears well-developed and well-nourished.  HENT:  Head: Normocephalic and atraumatic.  Eyes: Conjunctivae are normal.  Neck: Neck supple.  Cardiovascular: Normal rate.  Pulmonary/Chest: Effort normal. No respiratory distress.  Musculoskeletal: He exhibits no edema.  Neurological: He is alert.  Skin: Skin is warm and dry.  Small superficial 1 cm laceration to distal right  index finger on palmar surface.  Edges well approximated without manipulation.  No foreign bodies observed with manipulation.  Psychiatric: He has a normal mood and affect.  Nursing note and vitals reviewed.    UC Treatments / Results  Labs (all labs ordered are listed, but only abnormal results are displayed) Labs Reviewed - No data to display  EKG None  Radiology No results found.  Procedures Laceration Repair Date/Time: 02/19/2018 3:50 PM Performed by: Chae Oommen, Junius Creamer, PA-C Authorized by: Mardella Layman, MD   Consent:    Consent obtained:  Verbal   Consent given by:  Patient   Risks discussed:  Infection, pain and poor cosmetic result   Alternatives discussed:  No treatment Anesthesia (see MAR for exact dosages):    Anesthesia method:  None Laceration details:    Location:  Finger   Finger location:  R index finger   Length (cm):  1 Repair type:    Repair type:  Simple Exploration:    Hemostasis achieved with:  Direct pressure   Wound exploration:  wound explored through full range of motion     Wound extent: no foreign bodies/material noted, no muscle damage noted, no tendon damage noted, no underlying fracture noted and no vascular damage noted     Contaminated: no   Treatment:    Area cleansed with:  Soap and water   Amount of cleaning:  Standard   Irrigation solution:  Tap water   Irrigation volume:  50 mL   Irrigation method:  Syringe   Visualized foreign bodies/material removed: no   Skin repair:    Repair method:  Tissue adhesive Approximation:    Approximation:  Close Post-procedure details:    Dressing:  Tube gauze   Patient tolerance of procedure:  Tolerated well, no immediate complications   (including critical care time)  Medications Ordered in UC Medications - No data to display  Initial Impression / Assessment and Plan / UC Course  I have reviewed the triage vital signs and the nursing notes.  Pertinent labs & imaging results that were available during my care of the patient were reviewed by me and considered in my medical decision making (see chart for details).     Laceration repaired with Dermabond to give.  Edges were well approximated and laceration did not appear deep.  Discussed signs of infection to return otherwise does not need to return as they will dissolve on its own. Discussed strict return precautions. Patient verbalized understanding and is agreeable with plan.  Final Clinical Impressions(s) / UC Diagnoses   Final diagnoses:  Laceration of right index finger without foreign body without damage to nail, initial encounter     Discharge Instructions     Please keep area clean and dry.  The skin glue will slowly dissolve over time.  I expect your wound to heal on its own.  You do not need to return to have this removed.  Please return if you develop signs of infection including increased redness, pain, swelling or drainage.   ED Prescriptions    None     Controlled Substance  Prescriptions [place] Controlled Substance Registry consulted? Not Applicable   Lew Dawes, [place] 02/19/18 1552

## 2018-02-19 NOTE — Discharge Instructions (Addendum)
Please keep area clean and dry.  The skin glue will slowly dissolve over time.  I expect your wound to heal on its own.  You do not need to return to have this removed.  Please return if you develop signs of infection including increased redness, pain, swelling or drainage.

## 2018-02-19 NOTE — ED Triage Notes (Signed)
Pt here for laceration to right index finger. Reports that he was reaching into his tool box and there was a blade in there. Bleeding controlled.

## 2018-04-22 ENCOUNTER — Encounter (HOSPITAL_COMMUNITY): Payer: Self-pay

## 2018-04-22 ENCOUNTER — Emergency Department (HOSPITAL_COMMUNITY)
Admission: EM | Admit: 2018-04-22 | Discharge: 2018-04-22 | Disposition: A | Discharge status: Home / Self Care | Payer: 59 | Attending: Emergency Medicine | Admitting: Emergency Medicine

## 2018-04-22 ENCOUNTER — Emergency Department (HOSPITAL_COMMUNITY): Payer: 59

## 2018-04-22 ENCOUNTER — Other Ambulatory Visit: Payer: Self-pay

## 2018-04-22 DIAGNOSIS — Y999 Unspecified external cause status: Secondary | ICD-10-CM | POA: Diagnosis not present

## 2018-04-22 DIAGNOSIS — S2241XA Multiple fractures of ribs, right side, initial encounter for closed fracture: Secondary | ICD-10-CM | POA: Diagnosis not present

## 2018-04-22 DIAGNOSIS — F17210 Nicotine dependence, cigarettes, uncomplicated: Secondary | ICD-10-CM | POA: Insufficient documentation

## 2018-04-22 DIAGNOSIS — Y9355 Activity, bike riding: Secondary | ICD-10-CM | POA: Diagnosis not present

## 2018-04-22 DIAGNOSIS — J45909 Unspecified asthma, uncomplicated: Secondary | ICD-10-CM | POA: Diagnosis not present

## 2018-04-22 DIAGNOSIS — Y929 Unspecified place or not applicable: Secondary | ICD-10-CM | POA: Diagnosis not present

## 2018-04-22 DIAGNOSIS — S299XXA Unspecified injury of thorax, initial encounter: Secondary | ICD-10-CM | POA: Diagnosis present

## 2018-04-22 LAB — I-STAT CHEM 8, ED
BUN: 7 mg/dL (ref 6–20)
CALCIUM ION: 1.15 mmol/L (ref 1.15–1.40)
CHLORIDE: 104 mmol/L (ref 98–111)
CREATININE: 0.9 mg/dL (ref 0.61–1.24)
GLUCOSE: 100 mg/dL — AB (ref 70–99)
HCT: 48 % (ref 39.0–52.0)
Hemoglobin: 16.3 g/dL (ref 13.0–17.0)
Potassium: 4 mmol/L (ref 3.5–5.1)
Sodium: 139 mmol/L (ref 135–145)
TCO2: 21 mmol/L — AB (ref 22–32)

## 2018-04-22 MED ORDER — OXYCODONE-ACETAMINOPHEN 5-325 MG PO TABS: 1.0000 | ORAL_TABLET | ORAL | 0 refills | Status: DC | PRN

## 2018-04-22 MED ORDER — IOHEXOL 300 MG/ML  SOLN
80.0000 mL | Freq: Once | INTRAMUSCULAR | Status: AC | PRN
  Administered 2018-04-22: 15:00:00 via INTRAVENOUS

## 2018-04-22 MED ORDER — OXYCODONE-ACETAMINOPHEN 5-325 MG PO TABS
1.0000 | ORAL_TABLET | Freq: Once | ORAL | Status: AC
  Administered 2018-04-22: 1 via ORAL
  Filled 2018-04-22: qty 1

## 2018-04-22 NOTE — ED Notes (Signed)
Patient transported to X-ray 

## 2018-04-22 NOTE — Discharge Instructions (Addendum)
You have right fourth, fifth and sixth rib fractures.  No lung injury.  These will heal on their own.    No heavy lifting or strenuous activity until your pain is improved.  I have written you prescription for pain medicine to go home with.  This medicine can make you drowsy so please do not drive or work while taking it.  Use the incentive spirometer to help encourage deep breathing, at least 4 times a day.  Return to the ER if you have any new or concerning symptoms like fever, coughing up blood, trouble breathing.

## 2018-04-22 NOTE — Progress Notes (Signed)
RT completed incentive spirometry education. PT reached 2500.

## 2018-04-22 NOTE — ED Notes (Signed)
Patient ambulated independently to the restroom.  Gait steady and even.  PA now at bedside updating pt

## 2018-04-22 NOTE — ED Notes (Signed)
Patient able to ambulate independently  
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
## 2018-04-22 NOTE — ED Notes (Signed)
Respiratory therapy to come do Incentive Spirometry education
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
## 2018-12-07 ENCOUNTER — Ambulatory Visit (HOSPITAL_COMMUNITY)
Admission: RE | Admit: 2018-12-07 | Discharge: 2018-12-07 | Disposition: A | Discharge status: Home / Self Care | Payer: 59 | Attending: Psychiatry | Admitting: Psychiatry

## 2018-12-07 DIAGNOSIS — F1020 Alcohol dependence, uncomplicated: Secondary | ICD-10-CM | POA: Insufficient documentation

## 2018-12-07 DIAGNOSIS — F1120 Opioid dependence, uncomplicated: Secondary | ICD-10-CM | POA: Diagnosis not present

## 2018-12-07 DIAGNOSIS — Z885 Allergy status to narcotic agent status: Secondary | ICD-10-CM | POA: Diagnosis not present

## 2018-12-07 DIAGNOSIS — F331 Major depressive disorder, recurrent, moderate: Secondary | ICD-10-CM | POA: Diagnosis not present

## 2018-12-07 DIAGNOSIS — J45909 Unspecified asthma, uncomplicated: Secondary | ICD-10-CM | POA: Insufficient documentation

## 2018-12-07 NOTE — H&P (Signed)
Behavioral Health Medical Screening Exam  Billy Duke is an 28 y.o. male. Presents to [facility] as a walk in requesting information for substance abuse treatment. Reports using opiates and heroin for the past month. Patient reports he was hope to get cleaned before the birth of his newborn. States he is currently employed. Denied previous inpatient admission. Denies suicidal or homicidal ideations. Support, encouragement and reassuraces was provided.   Total Time spent with patient: 15 minutes  Psychiatric Specialty Exam: Physical Exam  Psychiatric: He has a normal mood and affect. His behavior is normal.    ROS  Blood pressure 136/85, pulse 86, temperature 97.6 F (36.4 C), temperature source Oral, resp. rate 18, SpO2 98 %.There is no height or weight on file to calculate BMI.  General Appearance: Guarded  Eye Contact:  Fair  Speech:  Clear and Coherent  Volume:  Normal  Mood:  Depressed  Affect:  Congruent  Thought Process:  Coherent  Orientation:  Full (Time, Place, and Person)  Thought Content:  WDL  Suicidal Thoughts:  No  Homicidal Thoughts:  No  Memory:  Immediate;   Fair Recent;   Fair Remote;   Fair  Judgement:  Fair  Insight:  Fair  Psychomotor Activity:  Normal  Concentration: Concentration: Fair  Recall:  [employer] of Knowledge:Fair  Language: Fair  Akathisia:  No  Handed:  Right  AIMS (if indicated):     Assets:  Communication Skills Desire for Improvement Social Support  Sleep:       Musculoskeletal: Strength & Muscle Tone: within normal limits Gait & Station: normal Patient leans: N/A  Blood pressure 136/85, pulse 86, temperature 97.6 F (36.4 C), temperature source Oral, resp. rate 18, SpO2 98 %.  Recommendations: TTS to provided additional resources for substance abuse NA/ AA  Based on my evaluation the patient does not appear to have an emergency medical condition.  Oneta Rack, NP 12/07/2018, 5:40 PM
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
## 2020-07-05 ENCOUNTER — Other Ambulatory Visit: Payer: Self-pay

## 2020-07-05 ENCOUNTER — Emergency Department (HOSPITAL_COMMUNITY): Payer: Self-pay

## 2020-07-05 ENCOUNTER — Emergency Department (HOSPITAL_COMMUNITY)
Admission: EM | Admit: 2020-07-05 | Discharge: 2020-07-05 | Disposition: A | Discharge status: Home / Self Care | Payer: Self-pay | Attending: Emergency Medicine | Admitting: Emergency Medicine

## 2020-07-05 ENCOUNTER — Encounter (HOSPITAL_COMMUNITY): Payer: Self-pay | Admitting: Emergency Medicine

## 2020-07-05 DIAGNOSIS — M546 Pain in thoracic spine: Secondary | ICD-10-CM | POA: Insufficient documentation

## 2020-07-05 DIAGNOSIS — M25512 Pain in left shoulder: Secondary | ICD-10-CM | POA: Insufficient documentation

## 2020-07-05 DIAGNOSIS — R519 Headache, unspecified: Secondary | ICD-10-CM | POA: Insufficient documentation

## 2020-07-05 DIAGNOSIS — Z5321 Procedure and treatment not carried out due to patient leaving prior to being seen by health care provider: Secondary | ICD-10-CM | POA: Insufficient documentation
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
## 2020-07-05 NOTE — ED Notes (Signed)
Pt told ED staff that he was leaving.

## 2020-07-05 NOTE — ED Notes (Signed)
Pt states

## 2023-08-01 ENCOUNTER — Encounter (HOSPITAL_COMMUNITY): Payer: Self-pay

## 2023-08-01 ENCOUNTER — Emergency Department (HOSPITAL_COMMUNITY)
Admission: EM | Admit: 2023-08-01 | Discharge: 2023-08-02 | Disposition: A | Discharge status: Home / Self Care | Payer: Self-pay | Attending: Emergency Medicine | Admitting: Emergency Medicine

## 2023-08-01 ENCOUNTER — Other Ambulatory Visit: Payer: Self-pay

## 2023-08-01 DIAGNOSIS — M545 Low back pain, unspecified: Secondary | ICD-10-CM | POA: Insufficient documentation

## 2023-08-01 DIAGNOSIS — F17200 Nicotine dependence, unspecified, uncomplicated: Secondary | ICD-10-CM | POA: Insufficient documentation

## 2023-08-01 DIAGNOSIS — J45909 Unspecified asthma, uncomplicated: Secondary | ICD-10-CM | POA: Insufficient documentation

## 2023-08-01 DIAGNOSIS — M7989 Other specified soft tissue disorders: Secondary | ICD-10-CM | POA: Insufficient documentation

## 2023-08-01 DIAGNOSIS — G8929 Other chronic pain: Secondary | ICD-10-CM | POA: Insufficient documentation

## 2023-08-01 NOTE — ED Triage Notes (Signed)
Pt arrived POV from home c/o left leg pain x3 days and some swelling. Pt states it is painful to walk. Pt also is c/o of lower back pain that radiates into both hips.

## 2023-08-02 ENCOUNTER — Emergency Department (HOSPITAL_COMMUNITY): Payer: Self-pay

## 2023-08-02 LAB — BASIC METABOLIC PANEL
Anion gap: 12 (ref 5–15)
BUN: <5 mg/dL — ABNORMAL LOW (ref 6–20)
CO2: 27 mmol/L (ref 22–32)
Calcium: 8.7 mg/dL — ABNORMAL LOW (ref 8.9–10.3)
Chloride: 101 mmol/L (ref 98–111)
Creatinine, Ser: 1.05 mg/dL (ref 0.61–1.24)
GFR, Estimated: >60 mL/min (ref >60)
Glucose, Bld: 91 mg/dL (ref 70–99)
Potassium: 3.2 mmol/L — ABNORMAL LOW (ref 3.5–5.1)
Sodium: 140 mmol/L (ref 135–145)

## 2023-08-02 LAB — CBC WITH DIFFERENTIAL/PLATELET
Abs Immature Granulocytes: 0.01 10*3/uL (ref 0.00–0.07)
Basophils Absolute: 0 10*3/uL (ref 0.0–0.1)
Basophils Relative: 1 %
Eosinophils Absolute: 0.4 10*3/uL (ref 0.0–0.5)
Eosinophils Relative: 6 %
HCT: 35.7 % — ABNORMAL LOW (ref 39.0–52.0)
Hemoglobin: 12.6 g/dL — ABNORMAL LOW (ref 13.0–17.0)
Immature Granulocytes: 0 %
Lymphocytes Relative: 41 %
Lymphs Abs: 2.6 10*3/uL (ref 0.7–4.0)
MCH: 33.1 pg (ref 26.0–34.0)
MCHC: 35.3 g/dL (ref 30.0–36.0)
MCV: 93.7 fL (ref 80.0–100.0)
Monocytes Absolute: 0.7 10*3/uL (ref 0.1–1.0)
Monocytes Relative: 11 %
Neutro Abs: 2.6 10*3/uL (ref 1.7–7.7)
Neutrophils Relative %: 41 %
Platelets: 198 10*3/uL (ref 150–400)
RBC: 3.81 MIL/uL — ABNORMAL LOW (ref 4.22–5.81)
RDW: 13 % (ref 11.5–15.5)
WBC: 6.3 10*3/uL (ref 4.0–10.5)
nRBC: 0 % (ref 0.0–0.2)

## 2023-08-02 LAB — CK: Total CK: 326 U/L (ref 49–397)

## 2023-08-02 MED ORDER — LIDOCAINE 5 % EX PTCH
1.0000 | MEDICATED_PATCH | CUTANEOUS | Status: DC
  Administered 2023-08-02: 1 via TRANSDERMAL
  Filled 2023-08-02: qty 1

## 2023-08-02 MED ORDER — ACETAMINOPHEN 325 MG PO TABS
650.0000 mg | ORAL_TABLET | Freq: Once | ORAL | Status: AC
  Administered 2023-08-02: 650 mg via ORAL
  Filled 2023-08-02: qty 2

## 2023-08-02 NOTE — ED Provider Notes (Signed)
Patient's care assumed at 6:30 AM.  Patient came to the emergency department complaining of bilateral lower leg swelling.  Patient is pending Doppler studies of bilateral lower legs.  Doppler shows no DVT.  Does show some enlarged lymph nodes.  Patient denies any UTI symptoms.  Patient denies any penile discharge.  Patient declines urinalysis.  Patient advised to follow-up with primary care.  Patient advised to return the emergency department if fever chills or worsening symptoms.   Billy Duke 08/02/23 7829    Glynn Octave, MD 08/02/23 2106

## 2023-08-02 NOTE — ED Provider Notes (Signed)
[place] [facility] AT [facility] Provider Note   CSN: [id number] Arrival date & time: 08/01/23  2053     History  Chief Complaint  Patient presents with   Leg Pain   Leg Swelling    Billy Duke is a 32 y.o. male.  32 year old male presents with complaint of bilateral lower extremity swelling.  First noticed swelling in his left calf 3 days ago without fall or injury.  Now noting some swelling starting up in his right ankle.  Denies personal or family history of PE or DVT, is a daily smoker.  Past medical history of asthma.  Also notes chronic low back pain, nothing new or different about this today but has never been evaluated previously.  No other complaints or concerns.       Home Medications Prior to Admission medications   Medication Sig Start Date End Date Taking? Authorizing Provider  ibuprofen (ADVIL,MOTRIN) 200 MG tablet Take 400 mg by mouth every 6 (six) hours as needed for mild pain.    [provider]  oxyCODONE-acetaminophen (PERCOCET/ROXICET) 5-325 MG tablet Take 1 tablet by mouth every 4 (four) hours as needed for severe pain. 04/22/18   Kellie Shropshire, PA-C      Allergies    Vicodin [hydrocodone-acetaminophen]    Review of Systems   Review of Systems Negative except as per HPI Physical Exam Updated Vital Signs BP 118/74   Pulse 60   Temp 98.4 F (36.9 C)   Resp 18   Ht 5\' 9"  (1.753 m)   Wt 93.4 kg   SpO2 100%   BMI 30.42 kg/m  Physical Exam Vitals and nursing note reviewed.  Constitutional:      General: He is not in acute distress.    Appearance: He is well-developed. He is not diaphoretic.  HENT:     Head: Normocephalic and atraumatic.  Cardiovascular:     Pulses: Normal pulses.  Pulmonary:     Effort: Pulmonary effort is normal.  Abdominal:     Palpations: Abdomen is soft.     Tenderness: There is no abdominal tenderness.  Musculoskeletal:        General: Swelling and tenderness present. No  deformity.     Thoracic back: No spasms, tenderness or bony tenderness.     Lumbar back: No spasms, tenderness or bony tenderness.     Comments: Swelling of left calf noted when compared to right.  DP pulses present, no overlying skin changes, no palpable cords.  Question left calf tenderness on exam.  Skin:    General: Skin is warm and dry.     Findings: No erythema or rash.  Neurological:     Mental Status: He is alert and oriented to person, place, and time.     Sensory: No sensory deficit.     Motor: No weakness.  Psychiatric:        Behavior: Behavior normal.     ED Results / Procedures / Treatments   Labs (all labs ordered are listed, but only abnormal results are displayed) Labs Reviewed  BASIC METABOLIC PANEL  CBC WITH DIFFERENTIAL/PLATELET  CK    EKG None  Radiology No results found.  Procedures Procedures    Medications Ordered in ED Medications  lidocaine (LIDODERM) 5 % 1 patch (has no administration in time range)  acetaminophen (TYLENOL) tablet 650 mg (has no administration in time range)    ED Course/ Medical Decision Making/ A&P  Medical Decision Making  This patient presents to the ED for concern of leg swelling, back pain, this involves an extensive number of treatment options, and is a complaint that carries with it a high risk of complications and morbidity.  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  The pertinent results include: CBC, BMP, CK.  Results pending at time of signout to oncoming provider   Imaging Studies ordered:  I ordered imaging studies including venous  Doppler bilateral lower extremities for swelling Results pending at time of signout oncoming provider   Problem List / ED Course / Critical interventions / Medication management  32 year old male with past medical history of asthma and polysubstance abuse presents with complaint of bilateral lower extremity pain and swelling, more so in the left leg.  On exam does have swelling of the left calf with questionable tenderness, no overlying skin changes, DP pulses present, sensation intact.  Plan is to obtain venous Doppler of bilateral lower extremities.  Will also check basic labs to include CK.  Care signed out to oncoming provider at change of shift pending labs and imaging. I ordered medication including Tylenol, Lidoderm for back pain I have reviewed the patients home medicines and have made adjustments as needed   Social Determinants of Health:  No PCP on file   Test / Admission - Considered:  Disposition pending at time of signout         Final Clinical Impression(s) / ED Diagnoses Final diagnoses:  Leg swelling  Chronic bilateral low back pain without sciatica    Rx / DC Orders ED Discharge Orders     None         Jeannie Fend, PA-C 08/02/23 0602    Glynn Octave, MD 08/02/23 [phone]

## 2023-08-02 NOTE — Discharge Instructions (Signed)
Schedule to see your primary care Physician for recheck.  Tylenol for discomfort.

## 2023-08-02 NOTE — Progress Notes (Signed)
VASCULAR LAB    Bilateral lower extremity venous duplex has been performed.  See CV proc for preliminary results.  Messaged negative results to Cheron Schaumann, PA-C via secure chat  Sherren Kerns, RVT 08/02/2023, 8:59 AM

## 2023-08-02 NOTE — ED Notes (Signed)
Patient discharged by this RN. Patient verbalized understanding of instructions with no additional questions. Ambulatory to lobby at discharge.

## 2023-08-02 NOTE — ED Notes (Signed)
Patient transported to Vascular Ultrasound

## 2023-10-16 ENCOUNTER — Emergency Department (HOSPITAL_COMMUNITY): Payer: Self-pay

## 2023-10-16 ENCOUNTER — Encounter (HOSPITAL_COMMUNITY): Payer: Self-pay | Admitting: Emergency Medicine

## 2023-10-16 ENCOUNTER — Emergency Department (HOSPITAL_COMMUNITY)
Admission: EM | Admit: 2023-10-16 | Discharge: 2023-10-16 | Discharge status: Against Medical Advice | Payer: Self-pay | Attending: Emergency Medicine | Admitting: Emergency Medicine

## 2023-10-16 DIAGNOSIS — M545 Low back pain, unspecified: Secondary | ICD-10-CM | POA: Insufficient documentation

## 2023-10-16 DIAGNOSIS — Z5321 Procedure and treatment not carried out due to patient leaving prior to being seen by health care provider: Secondary | ICD-10-CM | POA: Insufficient documentation

## 2023-10-16 NOTE — ED Triage Notes (Signed)
Pt arriving via POV for lower back pain x 2-3 days. Pt reports he gets a sharp pain when he tries to stand up. Pt reports the pain also radiates to his hips. Pt reports he was not doing anything specific when the pain started.

## 2024-04-30 ENCOUNTER — Emergency Department (HOSPITAL_COMMUNITY)

## 2024-04-30 ENCOUNTER — Emergency Department (HOSPITAL_COMMUNITY)
Admission: EM | Admit: 2024-04-30 | Discharge: 2024-05-01 | Disposition: A | Discharge status: Other Acute Inpatient Hospital | Attending: Emergency Medicine | Admitting: Emergency Medicine

## 2024-04-30 ENCOUNTER — Encounter (HOSPITAL_COMMUNITY): Payer: Self-pay | Admitting: Emergency Medicine

## 2024-04-30 DIAGNOSIS — F1420 Cocaine dependence, uncomplicated: Secondary | ICD-10-CM | POA: Diagnosis not present

## 2024-04-30 DIAGNOSIS — F17200 Nicotine dependence, unspecified, uncomplicated: Secondary | ICD-10-CM | POA: Diagnosis not present

## 2024-04-30 DIAGNOSIS — F333 Major depressive disorder, recurrent, severe with psychotic symptoms: Secondary | ICD-10-CM | POA: Diagnosis present

## 2024-04-30 DIAGNOSIS — F1120 Opioid dependence, uncomplicated: Secondary | ICD-10-CM | POA: Insufficient documentation

## 2024-04-30 DIAGNOSIS — F1520 Other stimulant dependence, uncomplicated: Secondary | ICD-10-CM | POA: Diagnosis not present

## 2024-04-30 DIAGNOSIS — Y900 Blood alcohol level of less than 20 mg/100 ml: Secondary | ICD-10-CM | POA: Insufficient documentation

## 2024-04-30 DIAGNOSIS — R4182 Altered mental status, unspecified: Secondary | ICD-10-CM | POA: Diagnosis not present

## 2024-04-30 DIAGNOSIS — R7309 Other abnormal glucose: Secondary | ICD-10-CM | POA: Diagnosis not present

## 2024-04-30 LAB — BLOOD GAS, VENOUS
Acid-Base Excess: 2.2 mmol/L — ABNORMAL HIGH (ref 0.0–2.0)
Bicarbonate: 27.3 mmol/L (ref 20.0–28.0)
O2 Saturation: 79.6 %
Patient temperature: 37
pCO2, Ven: 43 mmHg — ABNORMAL LOW (ref 44–60)
pH, Ven: 7.41 (ref 7.25–7.43)
pO2, Ven: 42 mmHg (ref 32–45)

## 2024-04-30 LAB — URINALYSIS, ROUTINE W REFLEX MICROSCOPIC
Bilirubin Urine: NEGATIVE
Glucose, UA: NEGATIVE mg/dL
Hgb urine dipstick: NEGATIVE
Ketones, ur: NEGATIVE mg/dL
Leukocytes,Ua: NEGATIVE
Nitrite: NEGATIVE
Protein, ur: NEGATIVE mg/dL
Specific Gravity, Urine: 1.025 (ref 1.005–1.030)
pH: 6.5 (ref 5.0–8.0)

## 2024-04-30 LAB — RAPID URINE DRUG SCREEN, HOSP PERFORMED
Amphetamines: POSITIVE — AB
Amphetamines: POSITIVE — AB
Barbiturates: NOT DETECTED
Barbiturates: NOT DETECTED
Benzodiazepines: NOT DETECTED
Benzodiazepines: NOT DETECTED
Cocaine: POSITIVE — AB
Cocaine: POSITIVE — AB
Opiates: NOT DETECTED
Opiates: NOT DETECTED
Tetrahydrocannabinol: NOT DETECTED
Tetrahydrocannabinol: NOT DETECTED

## 2024-04-30 LAB — ACETAMINOPHEN LEVEL: Acetaminophen (Tylenol), Serum: <10 ug/mL — ABNORMAL LOW (ref 10–30)

## 2024-04-30 LAB — I-STAT CHEM 8, ED
BUN: 12 mg/dL (ref 6–20)
Calcium, Ion: 1.13 mmol/L — ABNORMAL LOW (ref 1.15–1.40)
Chloride: 102 mmol/L (ref 98–111)
Creatinine, Ser: 1 mg/dL (ref 0.61–1.24)
Glucose, Bld: 147 mg/dL — ABNORMAL HIGH (ref 70–99)
HCT: 41 % (ref 39.0–52.0)
Hemoglobin: 13.9 g/dL (ref 13.0–17.0)
Potassium: 3.8 mmol/L (ref 3.5–5.1)
Sodium: 140 mmol/L (ref 135–145)
TCO2: 26 mmol/L (ref 22–32)

## 2024-04-30 LAB — COMPREHENSIVE METABOLIC PANEL WITH GFR
ALT: 29 U/L (ref 0–44)
AST: 38 U/L (ref 15–41)
Albumin: 4.2 g/dL (ref 3.5–5.0)
Alkaline Phosphatase: 45 U/L (ref 38–126)
Anion gap: 11 (ref 5–15)
BUN: 13 mg/dL (ref 6–20)
CO2: 24 mmol/L (ref 22–32)
Calcium: 8.8 mg/dL — ABNORMAL LOW (ref 8.9–10.3)
Chloride: 101 mmol/L (ref 98–111)
Creatinine, Ser: 0.9 mg/dL (ref 0.61–1.24)
GFR, Estimated: >60 mL/min (ref >60)
Glucose, Bld: 149 mg/dL — ABNORMAL HIGH (ref 70–99)
Potassium: 3.8 mmol/L (ref 3.5–5.1)
Sodium: 136 mmol/L (ref 135–145)
Total Bilirubin: 1 mg/dL (ref 0.0–1.2)
Total Protein: 7.4 g/dL (ref 6.5–8.1)

## 2024-04-30 LAB — CBC
HCT: 41.9 % (ref 39.0–52.0)
Hemoglobin: 14.4 g/dL (ref 13.0–17.0)
MCH: 31 pg (ref 26.0–34.0)
MCHC: 34.4 g/dL (ref 30.0–36.0)
MCV: 90.1 fL (ref 80.0–100.0)
Platelets: 263 K/uL (ref 150–400)
RBC: 4.65 MIL/uL (ref 4.22–5.81)
RDW: 13.9 % (ref 11.5–15.5)
WBC: 9.5 K/uL (ref 4.0–10.5)
nRBC: 0 % (ref 0.0–0.2)

## 2024-04-30 LAB — CK: Total CK: 350 U/L (ref 49–397)

## 2024-04-30 LAB — CBG MONITORING, ED: Glucose-Capillary: 145 mg/dL — ABNORMAL HIGH (ref 70–99)

## 2024-04-30 LAB — SALICYLATE LEVEL: Salicylate Lvl: <7 mg/dL — ABNORMAL LOW (ref 7.0–30.0)

## 2024-04-30 LAB — I-STAT CG4 LACTIC ACID, ED: Lactic Acid, Venous: 1.1 mmol/L (ref 0.5–1.9)

## 2024-04-30 LAB — ETHANOL: Alcohol, Ethyl (B): <15 mg/dL (ref <15)

## 2024-04-30 MED ORDER — LORAZEPAM 1 MG PO TABS: 1.0000 mg | ORAL_TABLET | Freq: Four times a day (QID) | ORAL | Status: DC | PRN

## 2024-04-30 NOTE — ED Notes (Signed)
 To be changed into scrubs and moved to pacu. 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

## 2024-04-30 NOTE — ED Triage Notes (Addendum)
 Pt walked into department and sat in lobby chair. Pt found slumped in chair and would not respond to verbal stimuli. RN had to place ammonia under pt nose to get him into a wheelchair. When pt was placed in room, security and off duty checked pts pockets and found drug paraphernalia including a crack pipe in his pockets. Pt would not tell staff his name. RN found pt ID in his wallet after his clothing had been removed. During assessment, pt stated I killed my mom. Staff unsure if this statement is true or hallucination from potential drug use.

## 2024-04-30 NOTE — ED Notes (Signed)
 Went in to introduce myself and ask pt why he is here and if he is in pain. No response. Security and police at bedside

## 2024-04-30 NOTE — ED Notes (Signed)
Pt was given a tray.

## 2024-04-30 NOTE — ED Notes (Signed)
 Patient said he was having bad hallucinations that someone was going to kill him and he said people are out to get him. He is hearing voices. Has schizophrenia and bipolar but not taking medications.

## 2024-04-30 NOTE — BH Assessment (Signed)
 Clinician messaged Kady L.  Les, Paramedic; Vina Mink, NT and Altha GEANNIE Cons, NT: Billy Duke. It's [name] with TTS. Is the pt able to engage in the assessment, if so the pt will need to be placed in a private room. Is the pt under IVC? Also is the pt medically cleared?   Clinician awaiting response.    Jackson JONETTA Broach, MS, [facility], [facility] Triage Specialist [phone]

## 2024-04-30 NOTE — BH Assessment (Signed)
 Clinician waiting on TTS cart to be set up cart to complete pt's assessment.     Jackson JONETTA Broach, MS, [facility], [facility] Triage Specialist [phone]

## 2024-04-30 NOTE — ED Notes (Addendum)
 Pt was given stuff to take a shower
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
## 2024-04-30 NOTE — ED Notes (Signed)
 This pt has been respectful and pleasant since coming back to this unit. Pt had a shower earlier in the evening as well.

## 2024-04-30 NOTE — ED Provider Notes (Signed)
 [place] EMERGENCY DEPARTMENT AT [facility] Provider Note   CSN: [id number] Arrival date & time: 04/30/24  1415     Patient presents with: Altered Mental Status   Billy Duke is a 33 y.o. male in from the street with unknown past medical history, appearing intoxicated to the waiting room attendance, and security officers, initially awake but then falling asleep, along, dilated pupils, sweaty, red, arousable to ammonia/sternal rub.  Pipe found in pocket.  When aroused he will not tell me any useful information such as his name but does seem to show understanding, when I asked him what he took he reports everything.   HPI     Prior to Admission medications   Not on File    Allergies: Patient has no allergy information on record.    Review of Systems  All other systems reviewed and are negative.   Updated Vital Signs BP (!) 149/78 (BP Location: Left Arm)   Pulse (!) 115   Temp 98.7 F (37.1 C) (Oral)   Resp 18   SpO2 100%   Physical Exam Vitals and nursing note reviewed.  Constitutional:      General: He is not in acute distress.    Appearance: Normal appearance.  HENT:     Head: Normocephalic and atraumatic.  Eyes:     General:        Right eye: No discharge.        Left eye: No discharge.  Cardiovascular:     Rate and Rhythm: Normal rate and regular rhythm.     Heart sounds: No murmur heard.    No friction rub. No gallop.  Pulmonary:     Effort: Pulmonary effort is normal.     Breath sounds: Normal breath sounds.  Abdominal:     General: Bowel sounds are normal.     Palpations: Abdomen is soft.  Skin:    General: Skin is warm and dry.     Capillary Refill: Capillary refill takes less than 2 seconds.  Neurological:     Mental Status: He is alert and oriented to person, place, and time.  Psychiatric:        Mood and Affect: Mood normal.        Behavior: Behavior normal.     (all labs ordered are listed, but only abnormal results  are displayed) Labs Reviewed  COMPREHENSIVE METABOLIC PANEL WITH GFR - Abnormal; Notable for the following components:      Result Value   Glucose, Bld 149 (*)    Calcium 8.8 (*)    All other components within normal limits  RAPID URINE DRUG SCREEN, HOSP PERFORMED - Abnormal; Notable for the following components:   Cocaine POSITIVE (*)    Amphetamines POSITIVE (*)    All other components within normal limits  ACETAMINOPHEN  LEVEL - Abnormal; Notable for the following components:   Acetaminophen  (Tylenol ), Serum <10 (*)    All other components within normal limits  SALICYLATE LEVEL - Abnormal; Notable for the following components:   Salicylate Lvl <7.0 (*)    All other components within normal limits  RAPID URINE DRUG SCREEN, HOSP PERFORMED - Abnormal; Notable for the following components:   Cocaine POSITIVE (*)    Amphetamines POSITIVE (*)    All other components within normal limits  BLOOD GAS, VENOUS - Abnormal; Notable for the following components:   pCO2, Ven 43 (*)    Acid-Base Excess 2.2 (*)    All other components within normal limits  CBG MONITORING, ED - Abnormal; Notable for the following components:   Glucose-Capillary 145 (*)    All other components within normal limits  I-STAT CHEM 8, ED - Abnormal; Notable for the following components:   Glucose, Bld 147 (*)    Calcium, Ion 1.13 (*)    All other components within normal limits  CBC  ETHANOL  URINALYSIS, ROUTINE W REFLEX MICROSCOPIC  CK  I-STAT CG4 LACTIC ACID, ED  I-STAT CG4 LACTIC ACID, ED    EKG: EKG Interpretation Date/Time:  Friday April 30 2024 14:32:40 EDT Ventricular Rate:  117 PR Interval:  148 QRS Duration:  100 QT Interval:  355 QTC Calculation: 496 R Axis:   80  Text Interpretation: Sinus tachycardia Borderline prolonged QT interval no prior ECG for comparison No STEMI Confirmed by Ginger Barefoot [phone]) on 04/30/2024 2:49:12 PM  Radiology: DG Chest Portable 1 View Result Date:  04/30/2024 CLINICAL DATA:  Altered mental status. EXAM: PORTABLE CHEST 1 VIEW COMPARISON:  04/22/2018. FINDINGS: Low lung volume. Bilateral lung fields are clear. Bilateral costophrenic angles are clear. Normal cardio-mediastinal silhouette. No acute osseous abnormalities. The soft tissues are within normal limits. IMPRESSION: No active disease. Electronically Signed   By: Ree Molt M.D.   On: 04/30/2024 14:49     Procedures   Medications Ordered in the ED  LORazepam  (ATIVAN ) tablet 1 mg (has no administration in time range)    Clinical Course as of 04/30/24 1722  Fri Apr 30, 2024  1522 UDS positive for cocaine and amphetamines [CP]    Clinical Course User Index [CP] Rosan Sherlean DEL, PA-C                                 Medical Decision Making Amount and/or Complexity of Data Reviewed Labs: ordered. Radiology: ordered.   This patient is a 33 y.o. male who presents to the ED for concern of brough in unresponsive, concern for drug use, this involves an extensive number of treatment options, and is a complaint that carries with it a high risk of complications and morbidity. The emergent differential diagnosis prior to evaluation includes, but is not limited to,  CVA, seizure, hypotension, sepsis, hypoglycemia, hypoxic encephalopathy, metabolic encephalopathy, polypharmacy, substance abuse, developing dementia or alzheimers, meningitis, encephalitis, hypertensive emergency, other systemic infection, acute alcohol intoxication, acute alcohol or other drug withdrawal or psychiatric manifestation vs other . This is not an exhaustive differential.   Past Medical History / Co-morbidities / Social History: History of drug use  Physical Exam: Physical exam performed. The pertinent findings include: Initially poorly responsive, but arouses to sternal rub, and pneumonia.  He is initially tachycardic.  He is quite red throughout, with evidence of sunburn, and disheveled appearing.  His  responses are concerning for homicidal ideation versus psychosis as he reports that he killed his mother.  Lab Tests: I ordered, and personally interpreted labs.  The pertinent results include: UDS is positive for cocaine and amphetamines which agrees with his dilated pupils, tachycardia.  CMP overall unremarkable other than mildly elevated glucose at 149.  Mild hypocalcemia, calcium 8.8, CBC unremarkable, UA unremarkable, VBG without acidosis, normal lactic acid of.  Normal CK.    Imaging Studies: I ordered imaging studies including Plain film chest x-ray with no evidence of acute intrathoracic abnormality. I agree with the radiologist interpretation.   Cardiac Monitoring:  The patient was maintained on a cardiac monitor.  My attending physician Dr. Tegeler  viewed and interpreted the cardiac monitored which showed an underlying rhythm of: sinus tachycardia, borderline prolonged QT. I agree with this interpretation.   Disposition: After consideration of the diagnostic results and the patients response to treatment, I feel that overall patient is medically cleared at this time, he was placed under IVC due to his concern for homicidal ideation / psychosis, will appreciate TTS recommendations now that he is medically cleared.   I discussed this case with my attending physician Dr. Ginger who cosigned this note including patient's presenting symptoms, physical exam, and planned diagnostics and interventions. Attending physician stated agreement with plan or made changes to plan which were implemented.     Final diagnoses:  None    ED Discharge Orders     None          Rosan Sherlean VEAR DEVONNA 04/30/24 1722    Tegeler, Lonni PARAS, MD 05/01/24 [phone]

## 2024-04-30 NOTE — ED Notes (Signed)
 Per security pt has been talking

## 2024-04-30 NOTE — ED Notes (Signed)
 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 [employer] called for pt transport.

## 2024-04-30 NOTE — ED Notes (Addendum)
 Patient changed out into burgundy scrubs. Wanded by security. Has 1 patient belonging bag located in locker 34.

## 2024-05-01 ENCOUNTER — Inpatient Hospital Stay (HOSPITAL_COMMUNITY)
Admission: AD | Admit: 2024-05-01 | Discharge: 2024-05-05 | DRG: 885 | Disposition: A | Discharge status: Home / Self Care | Source: Intra-hospital | Attending: Psychiatry | Admitting: Psychiatry

## 2024-05-01 ENCOUNTER — Encounter (HOSPITAL_COMMUNITY): Payer: Self-pay | Admitting: Urology

## 2024-05-01 DIAGNOSIS — Z5941 Food insecurity: Secondary | ICD-10-CM

## 2024-05-01 DIAGNOSIS — F17290 Nicotine dependence, other tobacco product, uncomplicated: Secondary | ICD-10-CM | POA: Diagnosis present

## 2024-05-01 DIAGNOSIS — Z833 Family history of diabetes mellitus: Secondary | ICD-10-CM

## 2024-05-01 DIAGNOSIS — F1510 Other stimulant abuse, uncomplicated: Secondary | ICD-10-CM | POA: Diagnosis present

## 2024-05-01 DIAGNOSIS — R45851 Suicidal ideations: Secondary | ICD-10-CM | POA: Diagnosis present

## 2024-05-01 DIAGNOSIS — Z5982 Transportation insecurity: Secondary | ICD-10-CM | POA: Diagnosis not present

## 2024-05-01 DIAGNOSIS — Z91199 Patient's noncompliance with other medical treatment and regimen due to unspecified reason: Secondary | ICD-10-CM | POA: Diagnosis not present

## 2024-05-01 DIAGNOSIS — Z5900 Homelessness unspecified: Secondary | ICD-10-CM

## 2024-05-01 DIAGNOSIS — F419 Anxiety disorder, unspecified: Secondary | ICD-10-CM | POA: Diagnosis present

## 2024-05-01 DIAGNOSIS — T50906A Underdosing of unspecified drugs, medicaments and biological substances, initial encounter: Secondary | ICD-10-CM | POA: Diagnosis present

## 2024-05-01 DIAGNOSIS — Z560 Unemployment, unspecified: Secondary | ICD-10-CM

## 2024-05-01 DIAGNOSIS — F1120 Opioid dependence, uncomplicated: Secondary | ICD-10-CM | POA: Diagnosis present

## 2024-05-01 DIAGNOSIS — Z62810 Personal history of physical and sexual abuse in childhood: Secondary | ICD-10-CM

## 2024-05-01 DIAGNOSIS — F1910 Other psychoactive substance abuse, uncomplicated: Secondary | ICD-10-CM | POA: Diagnosis present

## 2024-05-01 DIAGNOSIS — F1410 Cocaine abuse, uncomplicated: Secondary | ICD-10-CM | POA: Diagnosis present

## 2024-05-01 DIAGNOSIS — F17210 Nicotine dependence, cigarettes, uncomplicated: Secondary | ICD-10-CM | POA: Diagnosis present

## 2024-05-01 DIAGNOSIS — F333 Major depressive disorder, recurrent, severe with psychotic symptoms: Principal | ICD-10-CM | POA: Diagnosis present

## 2024-05-01 LAB — LIPID PANEL
Cholesterol: 141 mg/dL (ref 0–200)
HDL: 46 mg/dL (ref >40)
LDL Cholesterol: 69 mg/dL (ref 0–99)
Total CHOL/HDL Ratio: 3.1 ratio
Triglycerides: 131 mg/dL (ref <150)
VLDL: 26 mg/dL (ref 0–40)

## 2024-05-01 LAB — VITAMIN B12: Vitamin B-12: 331 pg/mL (ref 180–914)

## 2024-05-01 LAB — VITAMIN D 25 HYDROXY (VIT D DEFICIENCY, FRACTURES): Vit D, 25-Hydroxy: 53.91 ng/mL (ref 30–100)

## 2024-05-01 LAB — TSH: TSH: 0.672 u[IU]/mL (ref 0.350–4.500)

## 2024-05-01 LAB — HIV ANTIBODY (ROUTINE TESTING W REFLEX): HIV Screen 4th Generation wRfx: NONREACTIVE

## 2024-05-01 MED ORDER — METHADONE HCL 10 MG PO TABS
120.0000 mg | ORAL_TABLET | Freq: Every day | ORAL | Status: DC
  Administered 2024-05-01 – 2024-05-05 (×5): 120 mg via ORAL
  Filled 2024-05-01 (×5): qty 12

## 2024-05-01 MED ORDER — LORAZEPAM 2 MG/ML IJ SOLN: 2.0000 mg | Freq: Three times a day (TID) | INTRAMUSCULAR | Status: DC | PRN

## 2024-05-01 MED ORDER — HYDROXYZINE HCL 25 MG PO TABS
25.0000 mg | ORAL_TABLET | Freq: Three times a day (TID) | ORAL | Status: DC | PRN
  Administered 2024-05-01 – 2024-05-02 (×2): 25 mg via ORAL
  Filled 2024-05-01 (×5): qty 1

## 2024-05-01 MED ORDER — DIPHENHYDRAMINE HCL 25 MG PO CAPS: 50.0000 mg | ORAL_CAPSULE | Freq: Three times a day (TID) | ORAL | Status: DC | PRN

## 2024-05-01 MED ORDER — HALOPERIDOL LACTATE 5 MG/ML IJ SOLN: 10.0000 mg | Freq: Three times a day (TID) | INTRAMUSCULAR | Status: DC | PRN

## 2024-05-01 MED ORDER — ACETAMINOPHEN 325 MG PO TABS
650.0000 mg | ORAL_TABLET | Freq: Four times a day (QID) | ORAL | Status: DC | PRN
  Administered 2024-05-01: 650 mg via ORAL
  Filled 2024-05-01: qty 2

## 2024-05-01 MED ORDER — HALOPERIDOL 5 MG PO TABS: 5.0000 mg | ORAL_TABLET | Freq: Three times a day (TID) | ORAL | Status: DC | PRN

## 2024-05-01 MED ORDER — FLUOXETINE HCL 20 MG PO CAPS
20.0000 mg | ORAL_CAPSULE | Freq: Every day | ORAL | Status: DC
  Administered 2024-05-01 – 2024-05-05 (×5): 20 mg via ORAL
  Filled 2024-05-01: qty 1
  Filled 2024-05-01: qty 7
  Filled 2024-05-01 (×4): qty 1

## 2024-05-01 MED ORDER — HALOPERIDOL LACTATE 5 MG/ML IJ SOLN: 5.0000 mg | Freq: Three times a day (TID) | INTRAMUSCULAR | Status: DC | PRN

## 2024-05-01 MED ORDER — DIPHENHYDRAMINE HCL 50 MG/ML IJ SOLN: 50.0000 mg | Freq: Three times a day (TID) | INTRAMUSCULAR | Status: DC | PRN

## 2024-05-01 MED ORDER — ALUM & MAG HYDROXIDE-SIMETH 200-200-20 MG/5ML PO SUSP: 30.0000 mL | ORAL | Status: DC | PRN

## 2024-05-01 MED ORDER — TRAZODONE HCL 50 MG PO TABS
50.0000 mg | ORAL_TABLET | Freq: Every evening | ORAL | Status: DC | PRN
  Administered 2024-05-02: 50 mg via ORAL
  Filled 2024-05-01: qty 10
  Filled 2024-05-01: qty 1
  Filled 2024-05-01: qty 10
  Filled 2024-05-01: qty 1

## 2024-05-01 MED ORDER — NICOTINE POLACRILEX 2 MG MT GUM: 2.0000 mg | CHEWING_GUM | OROMUCOSAL | Status: DC | PRN

## 2024-05-01 MED ORDER — NICOTINE 21 MG/24HR TD PT24
21.0000 mg | MEDICATED_PATCH | Freq: Every day | TRANSDERMAL | Status: DC
  Administered 2024-05-01 – 2024-05-05 (×4): 21 mg via TRANSDERMAL
  Filled 2024-05-01 (×3): qty 1

## 2024-05-01 MED ORDER — MAGNESIUM HYDROXIDE 400 MG/5ML PO SUSP: 30.0000 mL | Freq: Every day | ORAL | Status: DC | PRN

## 2024-05-01 MED ORDER — ARIPIPRAZOLE 5 MG PO TABS
5.0000 mg | ORAL_TABLET | Freq: Every day | ORAL | Status: DC
Start: 2024-05-01 — End: 2024-05-03
  Administered 2024-05-01 – 2024-05-03 (×3): 5 mg via ORAL
  Filled 2024-05-01 (×3): qty 1

## 2024-05-01 NOTE — [facility] Group Notes (Signed)
 LCSW Wellness Group Note   05/01/2024 2:00pm  Type of Group and Topic: Psychoeducational Group:  Wellness  Participation Level:  Active  Description of Group  Wellness group introduces the topic and its focus on developing healthy habits across the spectrum and its relationship to a decrease in hospital admissions.  Six areas of wellness are discussed: physical, social spiritual, intellectual, occupational, and emotional.  Patients are asked to consider their current wellness habits and to identify areas of wellness where they are interested and able to focus on improvements.    Therapeutic Goals Patients will understand components of wellness and how they can positively impact overall health.  Patients will identify areas of wellness where they have developed good habits. Patients will identify areas of wellness where they would like to make improvements.    Summary of Patient Progress: pt attentive in group, made several contributions to the group discussion, appropriate comments.  Pt appeared to be speaking honestly, states he needs to work on all of the wellness areas, none of them are current strengths.  Pt mentioned specifically that he needs to find more social support.      Therapeutic Modalities: Cognitive Behavioral Therapy Psychoeducation    Bridget Cordella Simmonds, LCSW

## 2024-05-01 NOTE — Plan of Care (Signed)

## 2024-05-01 NOTE — Progress Notes (Signed)
   05/01/24 2100  Psych Admission Type (Psych Patients Only)  Admission Status Involuntary  Psychosocial Assessment  Patient Complaints Isolation  Eye Contact Brief  Facial Expression Flat  Affect Appropriate to circumstance  Speech Logical/coherent  Interaction Guarded  Motor Activity Slow  Appearance/Hygiene Unremarkable  Behavior Characteristics Cooperative;Appropriate to situation  Mood Depressed  Thought Process  Coherency WDL  Content WDL  Delusions None reported or observed  Perception WDL  Hallucination None reported or observed  Judgment Poor  Confusion None  Danger to Self  Current suicidal ideation? Denies  Self-Injurious Behavior No self-injurious ideation or behavior indicators observed or expressed   Agreement Not to Harm Self Yes  Description of Agreement verbal  Danger to Others  Danger to Others None reported or observed

## 2024-05-01 NOTE — Plan of Care (Signed)

## 2024-05-01 NOTE — ED Notes (Signed)
 [employer] arrived to transport pt. [employer] given IVC paperwork and pt belongings bag. Pt ambulated out to [employer] unit for transport.

## 2024-05-01 NOTE — H&P (Addendum)
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 and time was given for questions. --The patient consents to medication trial.   -- Metabolic profile and EKG monitoring obtained while on an atypical antipsychotic (BMI: Lipid Panel: HbgA1c: QTc:)   -- Encouraged patient to participate in unit milieu and in scheduled group therapies   Continue BH Agitation Protocol   --Haldol  5 mg, oral, 3 times daily as needed, mild agitation  --Benadryl  50 mg, oral, 3 times daily as needed, mild agitation                                      OR   --Haldol  injection 5 mg, IM, 3 times daily as needed, moderate agitation  --Benadryl  injection 50 mg, IM, 3 times daily as needed, moderate agitation  --Ativan  injection 2 mg, IM, 3 times daily as needed, moderate agitation                                      OR  --Haldol  injection 10 mg, IM, 3 times daily as needed, severe agitation  --Benadryl  injection 50 mg, IM, 3 times daily as needed, severe agitation  --Ativan  injection 2 mg, IM, 3 times daily as needed, severe agitation   Admission labs reviewed: CMP: Glucose 147 elevated, Anion gap 1.13 low, otherwise normal.  CBC: Within normal limits.  Urinalysis: Within normal limits.  BAL: Less than 15.  UDS: Positive for amphetamine, positive for cocaine.  New labs ordered: Vitamin D  25-hydroxy, vitamin B12, TSH, hemoglobin A1c, lipid panel.  EKG reviewed: Sinus tachycardia, ventricular rate 117, QT/QTc 355/496.  Safety and Monitoring:  Voluntary admission to inpatient psychiatric unit for safety, stabilization and treatment  Daily contact with patient to assess and evaluate symptoms and progress in treatment  Patient's case to be discussed in multi-disciplinary team meeting  Observation Level : q15 minute checks  Vital signs: q12 hours  Precautions: suicide, but pt currently verbally contracts for safety on unit?   Discharge Planning:  Social work and case management to assist with discharge planning and identification of hospital follow-up needs prior to discharge  Estimated LOS: 5-7?days  Discharge Concerns: Need to establish a safety plan; Medication compliance and effectiveness  Discharge Goals: Return home with outpatient referrals for mental health follow-up including medication management/psychotherapy.   Long Term Goal(s): Improvement in symptoms so as ready for  discharge  Short Term Goals: Ability to identify changes in lifestyle to reduce recurrence of condition will improve, Ability to verbalize feelings will improve, Ability to disclose and discuss suicidal ideas, Ability to demonstrate self-control will improve, Ability to identify and develop effective coping behaviors will improve, Ability to maintain clinical measurements within normal limits will improve, Compliance with prescribed medications will improve, and Ability to identify triggers associated with substance abuse/mental health issues will improve  Physician Treatment Plan for Secondary Diagnosis: Principal Problem:   MDD (major depressive disorder), recurrent, severe, with psychosis (HCC)  I certify that inpatient services furnished can reasonably be expected to improve the patient's condition.    Ellouise JAYSON Azure, FNP 7/12/202511:25 AM
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
## 2024-05-01 NOTE — Progress Notes (Addendum)
 Pt presented in an irritable mood initially, c/o having hot and cold sweats and irritability and needing Methadone . Pt methadone  was verified by MD and he received his dose @ 120 mg po. Pt later was pleasant cooperative and engaged with others. Pt was compliant with taking his other medications and was without any further complaints.     05/01/24 1400  Psych Admission Type (Psych Patients Only)  Admission Status Involuntary  Psychosocial Assessment  Patient Complaints Irritability  Eye Contact Brief  Facial Expression Anxious  Affect Anxious;Sad  Speech Logical/coherent  Interaction Guarded  Motor Activity Slow  Appearance/Hygiene Unremarkable  Behavior Characteristics Cooperative;Irritable  Mood Depressed  Thought Process  Coherency Circumstantial  Content WDL  Delusions None reported or observed  Perception WDL  Hallucination None reported or observed  Judgment Limited  Confusion None  Danger to Self  Current suicidal ideation? Passive  Agreement Not to Harm Self Yes  Description of Agreement verbal

## 2024-05-01 NOTE — Group Note (Signed)
 Date:  05/01/2024 Time:  10:16 PM  Group Topic/Focus:  Wrap-Up Group:   The focus of this group is to help patients review their daily goal of treatment and discuss progress on daily workbooks.    Participation Level:  Active  Participation Quality:  Appropriate and Attentive  Affect:  Appropriate  Cognitive:  Appropriate  Insight: Appropriate  Engagement in Group:  Engaged  Modes of Intervention:  Discussion  Additional Comments:  Patient stated that he had a okay day.  Stated he wants to work on his coping skills   Bari Moats 05/01/2024, 10:16 PM
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
## 2024-05-02 LAB — RPR: RPR Ser Ql: NONREACTIVE

## 2024-05-02 NOTE — Progress Notes (Signed)
 [facility] MD Progress Note  05/02/2024 12:55 PM Billy Duke  MRN:  [id number]  Principal Problem: MDD (major depressive disorder), recurrent, severe, with psychosis (HCC) Diagnosis: Principal Problem:   MDD (major depressive disorder), recurrent, severe, with psychosis (HCC)  33 year old Caucasian male with prior psychiatric history significant for MDD, recurrent severe, with psychotic features, polysubstance use including fentanyl, cocaine, and amphetamines, and self-reported history of bipolar disorder and schizophrenia, admitted for suicidal ideations in settings of polysubstance use and homelessness.   Interval History Patient was seen today for re-evaluation.  Nursing reports no events overnight. The patient has no issues with performing ADLs.  Patient has been medication compliant.    Patient was seen and interviewed by attending psychiatrist. Chart reviewed. Patient discussed during treatment team rounds.  Subjective:  On assessment patient reports feeling  He reports feeling better, less depressed, less anxious. He reports improved sleep and appetite. Denies suicidal ideations today. He denies current auditory or visual hallucinations, denies feeling paranoid, unsafe, does not express any delusions. He denies thoughts or plans of hurting self or others. He says medications work okay and  reports no side effects from medications he is getting here. He denies any physical complaints.  Labs: no new results for review.  Total Time spent with patient: 30 minutes  Past Psychiatric History: see H&P   Past Medical History: History reviewed. No pertinent past medical history. History reviewed. No pertinent surgical history. Family History: History reviewed. No pertinent family history. Family Psychiatric  History: see H&P Social History:  Social History   Substance and Sexual Activity  Alcohol Use Not Currently     Social History   Substance and Sexual Activity  Drug Use  Yes   Frequency: 7.0 times per week   Types: Crack cocaine, Amphetamines, Cocaine, Fentanyl, Marijuana, Opium   Comment: Drug use daily    Social History   Socioeconomic History   Marital status: Divorced    Spouse name: Not on file   Number of children: Not on file   Years of education: Not on file   Highest education level: Not on file  Occupational History   Not on file  Tobacco Use   Smoking status: Former    Current packs/day: 0.50    Average packs/day: 0.5 packs/day for 25.5 years (12.8 ttl pk-yrs)    Types: Cigarettes, E-cigarettes    Start date: 2000   Smokeless tobacco: Not on file  Vaping Use   Vaping status: Every Day  Substance and Sexual Activity   Alcohol use: Not Currently   Drug use: Yes    Frequency: 7.0 times per week    Types: Crack cocaine, Amphetamines, Cocaine, Fentanyl, Marijuana, Opium    Comment: Drug use daily   Sexual activity: Not Currently  Other Topics Concern   Not on file  Social History Narrative   Not on file   Social Drivers of Health   Financial Resource Strain: Not on file  Food Insecurity: Food Insecurity Present (05/01/2024)   Hunger Vital Sign    Worried About Running Out of Food in the Last Year: Often true    Ran Out of Food in the Last Year: Often true  Transportation Needs: Unmet Transportation Needs (05/01/2024)   PRAPARE - Administrator, Civil Service (Medical): Yes    Lack of Transportation (Non-Medical): Yes  Physical Activity: Not on file  Stress: Not on file  Social Connections: Not on file   Additional Social History:  Sleep: Good Estimated Sleeping Duration (Last 24 Hours): 8.00-9.25 hours  Appetite:  Good  Current Medications: Current Facility-Administered Medications  Medication Dose Route Frequency Provider Last Rate Last Admin   acetaminophen  (TYLENOL ) tablet 650 mg  650 mg Oral Q6H PRN Bobbitt, Shalon E, NP   650 mg at 05/01/24 0840   alum & mag  hydroxide-simeth (MAALOX/MYLANTA) 200-200-20 MG/5ML suspension 30 mL  30 mL Oral Q4H PRN Bobbitt, Shalon E, NP       ARIPiprazole  (ABILIFY ) tablet 5 mg  5 mg Oral Daily Ntuen, Tina C, FNP   5 mg at 05/02/24 9162   haloperidol  (HALDOL ) tablet 5 mg  5 mg Oral TID PRN Bobbitt, Shalon E, NP       And   diphenhydrAMINE  (BENADRYL ) capsule 50 mg  50 mg Oral TID PRN Bobbitt, Shalon E, NP       haloperidol  lactate (HALDOL ) injection 5 mg  5 mg Intramuscular TID PRN Bobbitt, Shalon E, NP       And   diphenhydrAMINE  (BENADRYL ) injection 50 mg  50 mg Intramuscular TID PRN Bobbitt, Shalon E, NP       And   LORazepam  (ATIVAN ) injection 2 mg  2 mg Intramuscular TID PRN Bobbitt, Shalon E, NP       haloperidol  lactate (HALDOL ) injection 10 mg  10 mg Intramuscular TID PRN Bobbitt, Shalon E, NP       And   diphenhydrAMINE  (BENADRYL ) injection 50 mg  50 mg Intramuscular TID PRN Bobbitt, Shalon E, NP       And   LORazepam  (ATIVAN ) injection 2 mg  2 mg Intramuscular TID PRN Bobbitt, Shalon E, NP       FLUoxetine  (PROZAC ) capsule 20 mg  20 mg Oral Daily Ntuen, Tina C, FNP   20 mg at 05/02/24 0835   hydrOXYzine  (ATARAX ) tablet 25 mg  25 mg Oral TID PRN Bobbitt, Shalon E, NP   25 mg at 05/02/24 0835   magnesium  hydroxide (MILK OF MAGNESIA) suspension 30 mL  30 mL Oral Daily PRN Bobbitt, Shalon E, NP       methadone  (DOLOPHINE ) tablet 120 mg  120 mg Oral Daily Ntuen, Tina C, FNP   120 mg at 05/02/24 9165   nicotine  (NICODERM CQ  - dosed in mg/24 hours) patch 21 mg  21 mg Transdermal Daily Ntuen, Tina C, FNP   21 mg at 05/01/24 1648   nicotine  polacrilex (NICORETTE ) gum 2 mg  2 mg Oral PRN Ntuen, Tina C, FNP       traZODone  (DESYREL ) tablet 50 mg  50 mg Oral QHS PRN Bobbitt, Shalon E, NP   50 mg at 05/02/24 9164    Lab Results:  Results for orders placed or performed during the hospital encounter of 05/01/24 (from the past 48 hours)  Vitamin B12     Status: None   Collection Time: 05/01/24  6:29 PM  Result Value Ref  Range   Vitamin B-12 331 180 - 914 pg/mL    Comment: (NOTE) This assay is not validated for testing neonatal or myeloproliferative syndrome specimens for Vitamin B12 levels. Performed at [facility], [address] [place], [place] [address]   VITAMIN D  25 Hydroxy (Vit-D Deficiency, Fractures)     Status: None   Collection Time: 05/01/24  6:29 PM  Result Value Ref Range   Vit D, 25-Hydroxy 53.91 30 - 100 ng/mL    Comment: (NOTE) Vitamin D  deficiency has been defined by the Institute of Medicine  and an Endocrine Society practice guideline as a level of serum 25-OH  vitamin D  less than 20 ng/mL (1,2). The Endocrine Society went on to  further define vitamin D  insufficiency as a level between 21 and 29  ng/mL (2).  1. IOM (Institute of Medicine). 2010. Dietary reference intakes for  calcium and D. [place]: [employer]. 2. Holick MF, Binkley [place], Bischoff-Ferrari HA, et al. Evaluation,  treatment, and prevention of vitamin D  deficiency: an Endocrine  Society clinical practice guideline, JCEM. 2011 Jul; 96(7): 1911-30.  Performed at [facility], [address], [place], [place] [address]   Lipid panel     Status: None   Collection Time: 05/01/24  6:29 PM  Result Value Ref Range   Cholesterol 141 0 - 200 mg/dL   Triglycerides 868 <849 mg/dL   HDL 46 >59 mg/dL   Total CHOL/HDL Ratio 3.1 RATIO   VLDL 26 0 - 40 mg/dL   LDL Cholesterol 69 0 - 99 mg/dL    Comment:        Total Cholesterol/HDL:CHD Risk Coronary Heart Disease Risk Table                     Men   Women  1/2 Average Risk   3.4   3.3  Average Risk       5.0   4.4  2 X Average Risk   9.6   7.1  3 X Average Risk  23.4   11.0        Use the calculated Patient Ratio above and the CHD Risk Table to determine the patient's CHD Risk.        ATP III CLASSIFICATION (LDL):  <100     mg/dL   Optimal  899-870  mg/dL   Near or Above                    Optimal  130-159  mg/dL    Borderline  839-810  mg/dL   High  >809     mg/dL   Very High Performed at [facility] [place], [address], [place], [place] [address]   TSH     Status: None   Collection Time: 05/01/24  6:29 PM  Result Value Ref Range   TSH 0.672 0.350 - 4.500 uIU/mL    Comment: Performed by a 3rd Generation assay with a functional sensitivity of <=0.01 uIU/mL. Performed at [facility], [address], [place], [place] [address]   RPR     Status: None   Collection Time: 05/01/24  6:29 PM  Result Value Ref Range   RPR Ser Ql NON REACTIVE NON REACTIVE    Comment: Performed at [facility], [address] [place], [place] [address]  HIV Antibody (routine testing w rflx)     Status: None   Collection Time: 05/01/24  6:29 PM  Result Value Ref Range   HIV Screen 4th Generation wRfx Non Reactive Non Reactive    Comment: Performed at [facility], [address], [place], [place] [address]    Blood Alcohol level:  Lab Results  Component Value Date   [facility] <15 04/30/2024    Metabolic Disorder Labs: No results found for: HGBA1C, MPG No results found for: PROLACTIN Lab Results  Component Value Date   CHOL 141 05/01/2024   TRIG 131 05/01/2024   HDL 46 05/01/2024   CHOLHDL 3.1 05/01/2024   VLDL  26 05/01/2024   LDLCALC 69 05/01/2024    Physical Findings: AIMS:  ,  ,  ,  ,  ,  ,   CIWA:    COWS:     Musculoskeletal: Strength & Muscle Tone: within normal limits Gait & Station: normal Patient leans: N/A  Psychiatric Specialty Exam:  Presentation  General Appearance:  Casual  Eye Contact: Fair  Speech: Clear and Coherent  Speech Volume: Normal  Handedness: Right   Mood and Affect  Mood: better  Affect: restricted   Thought Process  Thought Processes: appears organized  Descriptions of Associations:Intact  Orientation:Full (Time, Place and Person)  Thought Content: appears logical today  History of  Schizophrenia/Schizoaffective disorder:No  Duration of Psychotic Symptoms:Greater than six months  Hallucinations: denies today  Ideas of Reference: denies today  Suicidal Thoughts:Suicidal Thoughts: No  Homicidal Thoughts:Homicidal Thoughts: No   Sensorium  Memory: Immediate Fair; Recent Fair  Judgment: Fair  Insight: Fair   Art therapist  Concentration: Fair  Attention Span: Fair  Recall: Fair  Fund of Knowledge: Fair  Language: Good   Psychomotor Activity  Psychomotor Activity: Psychomotor Activity: Restlessness   Assets  Assets: Communication Skills; Physical Health; Resilience   Sleep  Sleep: Sleep: Good Number of Hours of Sleep: 10    Physical Exam: Physical Exam ROS Blood pressure 122/81, pulse (!) 45, temperature 98.6 F (37 C), temperature source Oral, resp. rate 18, height 5' 9 (1.753 m), weight 83.4 kg, SpO2 100%. Body mass index is 27.14 kg/m.   Treatment Plan Summary: Daily contact with patient to assess and evaluate symptoms and progress in treatment and Medication management  Patient is a 33 year old male with the above-stated past psychiatric history who is seen in follow-up.  Chart reviewed. Patient discussed with nursing. Patient reports mood improvement, no suicidal thoughts and improvement of psychotic sx after he started on medications.  Diagnoses/ Active problems: -MDD (major depressive disorder), recurrent, severe, with psychosis. -Substance use disorder   PLAN:  Safety and Monitoring: continue inpatient psych admission; 15-minute checks; daily contact with patient to assess and evaluate symptoms and progress in treatment; psychoeducation.Vital signs: q12 hours. Precautions: suicide, elopement, and assault. Placed on room lock out for meals, snacks and groups.  Psychiatric Problems: Medications: --continue Prozac  20 mg po daily for depression and anxiety --continue Abilify  5 mg po daily for  anti-depression augmentation and psychosis --continue Trazodone  50 mg po prn at bedtime for Insomnia --continue Hydroxyzine  tablets 25 mg po TID prn for anxiety --continue Methadone  (DOLOPHINE ) tablet 120 mg po daily for LT detoxification and maintenance treatment for opioid addiction --Nicotine  patch 21 mg transdermally to 24 hours daily for smoking cessation --Nicotine  gum 2 mg p.o. as needed for smoking cessation.   Other PRN Medications  -Acetaminophen  650 mg every 6 as needed/mild pain  -Maalox 30 mL oral every 4 as needed/digestion  -Magnesium  hydroxide 30 mL daily as needed/mild constipation    Pertinent Labs: no new labs ordered today  Consults: No new consults placed since yesterday    Discharge Planning: -Social work and case management to assist with discharge planning and identification of hospital follow-up needs prior to discharge -Estimated LOS: 3-5 days -Discharge Concerns: Need to establish a safety plan; Medication compliance and effectiveness -Discharge Goals: Return home with outpatient referrals for mental health follow-up including medication management/psychotherapy  Total Time Spent in Direct Patient Care:  I personally spent 35 minutes on the unit in direct patient care. The direct patient care time included face-to-face time with  the patient, reviewing the patient's chart, communicating with other professionals, and coordinating care. Greater than 50% of this time was spent in counseling or coordinating care with the patient regarding goals of hospitalization, psycho-education, and discharge planning needs.   Neil Appl, MD 05/02/2024, 12:55 PM

## 2024-05-02 NOTE — Progress Notes (Signed)
   05/02/24 2225  Psych Admission Type (Psych Patients Only)  Admission Status Involuntary  Psychosocial Assessment  Patient Complaints Isolation  Eye Contact Brief  Facial Expression Flat  Affect Appropriate to circumstance  Speech Logical/coherent  Interaction Guarded  Motor Activity Slow  Appearance/Hygiene Unremarkable  Behavior Characteristics Appropriate to situation  Mood Depressed  Thought Process  Coherency WDL  Content WDL  Delusions None reported or observed  Perception WDL  Hallucination None reported or observed  Judgment Poor  Confusion None  Danger to Self  Current suicidal ideation? Denies  Self-Injurious Behavior No self-injurious ideation or behavior indicators observed or expressed   Agreement Not to Harm Self Yes  Description of Agreement verbal  Danger to Others  Danger to Others None reported or observed

## 2024-05-02 NOTE — [facility] Group Notes (Signed)
 Psychoeducational Group Note  Date:  05/02/2024 Time:  2000  Group Topic/Focus:  Wrap up group  Participation Level: Did Not Attend  Participation Quality:  Not Applicable  Affect:  Not Applicable  Cognitive:  Not Applicable  Insight:  Not Applicable  Engagement in Group: Not Applicable  Additional Comments:  Did not attend.   Lenora Manuelita RAMAN 05/02/2024, 9:11 PM

## 2024-05-02 NOTE — Plan of Care (Signed)

## 2024-05-02 NOTE — Plan of Care (Signed)
  Problem: Health Behavior/Discharge Planning: Goal: Compliance with treatment plan for underlying cause of condition will improve Outcome: Progressing   Problem: Physical Regulation: Goal: Ability to maintain clinical measurements within normal limits will improve Outcome: Progressing   Problem: Safety: Goal: Periods of time without injury will increase Outcome: Progressing   

## 2024-05-02 NOTE — Progress Notes (Signed)
   05/02/24 1500  Psych Admission Type (Psych Patients Only)  Admission Status Involuntary  Psychosocial Assessment  Patient Complaints Isolation  Eye Contact Brief  Facial Expression Flat  Affect Appropriate to circumstance  Speech Logical/coherent  Interaction Guarded  Motor Activity Slow  Appearance/Hygiene Unremarkable  Behavior Characteristics Cooperative;Appropriate to situation  Mood Depressed  Thought Process  Coherency WDL  Content WDL  Delusions None reported or observed  Perception WDL  Hallucination None reported or observed  Judgment Poor  Confusion None  Danger to Self  Current suicidal ideation? Denies  Agreement Not to Harm Self Yes  Description of Agreement verbal  Danger to Others  Danger to Others None reported or observed

## 2024-05-03 ENCOUNTER — Encounter (HOSPITAL_COMMUNITY): Payer: Self-pay

## 2024-05-03 LAB — HEMOGLOBIN A1C
Hgb A1c MFr Bld: 5.4 % (ref 4.8–5.6)
Mean Plasma Glucose: 108 mg/dL

## 2024-05-03 MED ORDER — ARIPIPRAZOLE 10 MG PO TABS
10.0000 mg | ORAL_TABLET | Freq: Every day | ORAL | Status: DC
  Administered 2024-05-04 – 2024-05-05 (×2): 10 mg via ORAL
  Filled 2024-05-03 (×5): qty 1

## 2024-05-03 NOTE — Plan of Care (Signed)
   Problem: Education: Goal: Emotional status will improve Outcome: Progressing Goal: Mental status will improve Outcome: Progressing Goal: Verbalization of understanding the information provided will improve Outcome: Progressing

## 2024-05-03 NOTE — Progress Notes (Signed)
   05/03/24 0808  Psych Admission Type (Psych Patients Only)  Admission Status Involuntary  Psychosocial Assessment  Patient Complaints Irritability;Isolation  Eye Contact Fair  Facial Expression Flat  Affect Appropriate to circumstance  Speech Logical/coherent  Interaction Guarded  Motor Activity Slow  Appearance/Hygiene Unremarkable  Behavior Characteristics Appropriate to situation  Mood Depressed  Thought Process  Coherency WDL  Content WDL  Delusions None reported or observed  Perception WDL  Hallucination None reported or observed  Judgment Poor  Confusion None  Danger to Self  Current suicidal ideation? Denies  Agreement Not to Harm Self Yes  Description of Agreement Verbal  Danger to Others  Danger to Others None reported or observed

## 2024-05-03 NOTE — Progress Notes (Signed)
 [facility] MD Progress Note  05/03/2024 12:32 PM Billy Duke  MRN:  [id number]  Principal Problem: MDD (major depressive disorder), recurrent, severe, with psychosis (HCC) Diagnosis: Principal Problem:   MDD (major depressive disorder), recurrent, severe, with psychosis (HCC)  Reason for admission:  A 33 year old Caucasian male with prior psychiatric history significant for MDD, recurrent severe, with psychotic features, polysubstance use including fentanyl, cocaine, and amphetamines, and self-reported history of bipolar disorder and schizophrenia, admitted for suicidal ideations in settings of polysubstance use and homelessness.   24-hour chart review: Patient was seen today for re-evaluation.  Patient Case discussed with interdisciplinary team.  Nursing reports no events overnight. The patient has no issues with performing ADLs.  Patient has been medication compliant.  Patient discussed in interdisciplinary team meeting.  Vital signs without values.  No agitation protocol required  Subjective:  On assessment patient today, reports feeling less depressed, less anxious.  However, rates anxiety at #5/10, depression #6/10, with 10 being high severity.  He continues on Prozac  20 mg p.o. daily and Abilify  5 mg p.o. daily for depression and anxiety. No signs of EPS, cogwheel rigidity or akathisia. He reports improved sleep and appetite. Denies suicidal ideations today. He reports  auditory hallucination of hearing voices making fun of him.  He denies visual hallucinations, paranoia, being unsafe, or delusion. He denies thoughts or plans of hurting self or others. Reports, medications work okay and  reports no side effects from medications.  He denies any physical complaints.  When asked if he had visitation since admission, patient reports, no, my mom is in the nursing home and on my siblings are drug addicts and some in prison.  Active listening and emotional support provided to patient for ongoing  stressors.  Labs: no new results for review.  Total Time spent with patient: 45 minutes  Past Psychiatric History: see H&P  Past Medical History: History reviewed. No pertinent past medical history. History reviewed. No pertinent surgical history. Family History: History reviewed. No pertinent family history. Family Psychiatric  History: see H&P Social History:  Social History   Substance and Sexual Activity  Alcohol Use Not Currently     Social History   Substance and Sexual Activity  Drug Use Yes   Frequency: 7.0 times per week   Types: Crack cocaine, Amphetamines, Cocaine, Fentanyl, Marijuana, Opium   Comment: Drug use daily    Social History   Socioeconomic History   Marital status: Divorced    Spouse name: Not on file   Number of children: Not on file   Years of education: Not on file   Highest education level: Not on file  Occupational History   Not on file  Tobacco Use   Smoking status: Former    Current packs/day: 0.50    Average packs/day: 0.5 packs/day for 25.5 years (12.8 ttl pk-yrs)    Types: Cigarettes, E-cigarettes    Start date: 2000   Smokeless tobacco: Not on file  Vaping Use   Vaping status: Every Day  Substance and Sexual Activity   Alcohol use: Not Currently   Drug use: Yes    Frequency: 7.0 times per week    Types: Crack cocaine, Amphetamines, Cocaine, Fentanyl, Marijuana, Opium    Comment: Drug use daily   Sexual activity: Not Currently  Other Topics Concern   Not on file  Social History Narrative   Not on file   Social Drivers of Health   Financial Resource Strain: Not on file  Food Insecurity: Food Insecurity  Present (05/01/2024)   Hunger Vital Sign    Worried About Running Out of Food in the Last Year: Often true    Ran Out of Food in the Last Year: Often true  Transportation Needs: Unmet Transportation Needs (05/01/2024)   PRAPARE - Administrator, Civil Service (Medical): Yes    Lack of Transportation  (Non-Medical): Yes  Physical Activity: Not on file  Stress: Not on file  Social Connections: Not on file   Additional Social History:   Sleep: Good Estimated Sleeping Duration (Last 24 Hours): 6.25-7.50 hours  Appetite:  Good  Current Medications: Current Facility-Administered Medications  Medication Dose Route Frequency Provider Last Rate Last Admin   acetaminophen  (TYLENOL ) tablet 650 mg  650 mg Oral Q6H PRN Bobbitt, Shalon E, NP   650 mg at 05/01/24 0840   alum & mag hydroxide-simeth (MAALOX/MYLANTA) 200-200-20 MG/5ML suspension 30 mL  30 mL Oral Q4H PRN Bobbitt, Shalon E, NP       ARIPiprazole  (ABILIFY ) tablet 5 mg  5 mg Oral Daily Khristie Sak C, FNP   5 mg at 05/03/24 9191   haloperidol  (HALDOL ) tablet 5 mg  5 mg Oral TID PRN Bobbitt, Shalon E, NP       And   diphenhydrAMINE  (BENADRYL ) capsule 50 mg  50 mg Oral TID PRN Bobbitt, Shalon E, NP       haloperidol  lactate (HALDOL ) injection 5 mg  5 mg Intramuscular TID PRN Bobbitt, Shalon E, NP       And   diphenhydrAMINE  (BENADRYL ) injection 50 mg  50 mg Intramuscular TID PRN Bobbitt, Shalon E, NP       And   LORazepam  (ATIVAN ) injection 2 mg  2 mg Intramuscular TID PRN Bobbitt, Shalon E, NP       haloperidol  lactate (HALDOL ) injection 10 mg  10 mg Intramuscular TID PRN Bobbitt, Shalon E, NP       And   diphenhydrAMINE  (BENADRYL ) injection 50 mg  50 mg Intramuscular TID PRN Bobbitt, Shalon E, NP       And   LORazepam  (ATIVAN ) injection 2 mg  2 mg Intramuscular TID PRN Bobbitt, Shalon E, NP       FLUoxetine  (PROZAC ) capsule 20 mg  20 mg Oral Daily Yoskar Murrillo C, FNP   20 mg at 05/03/24 9191   hydrOXYzine  (ATARAX ) tablet 25 mg  25 mg Oral TID PRN Bobbitt, Shalon E, NP   25 mg at 05/02/24 0835   magnesium  hydroxide (MILK OF MAGNESIA) suspension 30 mL  30 mL Oral Daily PRN Bobbitt, Shalon E, NP       methadone  (DOLOPHINE ) tablet 120 mg  120 mg Oral Daily Jlen Wintle C, FNP   120 mg at 05/03/24 9191   nicotine  (NICODERM CQ  - dosed in  mg/24 hours) patch 21 mg  21 mg Transdermal Daily Jewell Haught C, FNP   21 mg at 05/01/24 1648   nicotine  polacrilex (NICORETTE ) gum 2 mg  2 mg Oral PRN Magdelene Ruark C, FNP       traZODone  (DESYREL ) tablet 50 mg  50 mg Oral QHS PRN Bobbitt, Shalon E, NP   50 mg at 05/02/24 9164   Lab Results:  Results for orders placed or performed during the hospital encounter of 05/01/24 (from the past 48 hours)  Vitamin B12     Status: None   Collection Time: 05/01/24  6:29 PM  Result Value Ref Range   Vitamin B-12 331 180 - 914 pg/mL  Comment: (NOTE) This assay is not validated for testing neonatal or myeloproliferative syndrome specimens for Vitamin B12 levels. Performed at [facility], [address], [place], [place] [address]   VITAMIN D  25 Hydroxy (Vit-D Deficiency, Fractures)     Status: None   Collection Time: 05/01/24  6:29 PM  Result Value Ref Range   Vit D, 25-Hydroxy 53.91 30 - 100 ng/mL    Comment: (NOTE) Vitamin D  deficiency has been defined by the Institute of Medicine  and an Endocrine Society practice guideline as a level of serum 25-OH  vitamin D  less than 20 ng/mL (1,2). The Endocrine Society went on to  further define vitamin D  insufficiency as a level between 21 and 29  ng/mL (2).  1. IOM (Institute of Medicine). 2010. Dietary reference intakes for  calcium and D. [place]: [employer]. 2. Holick MF, [place], Bischoff-Ferrari HA, et al. Evaluation,  treatment, and prevention of vitamin D  deficiency: an Endocrine  Society clinical practice guideline, JCEM. 2011 Jul; 96(7): 1911-30.  Performed at [facility], [address] [place], [place] [address]   Lipid panel     Status: None   Collection Time: 05/01/24  6:29 PM  Result Value Ref Range   Cholesterol 141 0 - 200 mg/dL   Triglycerides 868 <849 mg/dL   HDL 46 >59 mg/dL   Total CHOL/HDL Ratio 3.1 RATIO   VLDL 26 0 - 40 mg/dL   LDL Cholesterol 69 0 - 99 mg/dL     Comment:        Total Cholesterol/HDL:CHD Risk Coronary Heart Disease Risk Table                     Men   Women  1/2 Average Risk   3.4   3.3  Average Risk       5.0   4.4  2 X Average Risk   9.6   7.1  3 X Average Risk  23.4   11.0        Use the calculated Patient Ratio above and the CHD Risk Table to determine the patient's CHD Risk.        ATP III CLASSIFICATION (LDL):  <100     mg/dL   Optimal  899-870  mg/dL   Near or Above                    Optimal  130-159  mg/dL   Borderline  839-810  mg/dL   High  >809     mg/dL   Very High Performed at [facility], [address] [place], [place] [address]   TSH     Status: None   Collection Time: 05/01/24  6:29 PM  Result Value Ref Range   TSH 0.672 0.350 - 4.500 uIU/mL    Comment: Performed by a 3rd Generation assay with a functional sensitivity of <=0.01 uIU/mL. Performed at [facility], [address] [place], [place] [address]   Hemoglobin A1c     Status: None   Collection Time: 05/01/24  6:29 PM  Result Value Ref Range   Hgb A1c MFr Bld 5.4 4.8 - 5.6 %    Comment: (NOTE)         Prediabetes: 5.7 - 6.4         Diabetes: >6.4         Glycemic control for adults with diabetes: <7.0    Mean  Plasma Glucose 108 mg/dL    Comment: (NOTE) Performed At: [facility] [address] [place], [place] [id number] Jennette Shorter MD Ey:[id number]   RPR     Status: None   Collection Time: 05/01/24  6:29 PM  Result Value Ref Range   RPR Ser Ql NON REACTIVE NON REACTIVE    Comment: Performed at [facility], [address] [place], [place] [address]  HIV Antibody (routine testing w rflx)     Status: None   Collection Time: 05/01/24  6:29 PM  Result Value Ref Range   HIV Screen 4th Generation wRfx Non Reactive Non Reactive    Comment: Performed at [facility], [address], [place], [place] [address]   Blood Alcohol level:  Lab Results  Component Value Date   [facility]  <15 04/30/2024   Metabolic Disorder Labs: Lab Results  Component Value Date   HGBA1C 5.4 05/01/2024   MPG 108 05/01/2024   No results found for: PROLACTIN Lab Results  Component Value Date   CHOL 141 05/01/2024   TRIG 131 05/01/2024   HDL 46 05/01/2024   CHOLHDL 3.1 05/01/2024   VLDL 26 05/01/2024   LDLCALC 69 05/01/2024   Physical Findings: AIMS:  ,  ,  ,  ,  ,  ,   CIWA:    COWS:     Musculoskeletal: Strength & Muscle Tone: within normal limits Gait & Station: normal Patient leans: N/A  Psychiatric Specialty Exam:  Presentation  General Appearance:  Appropriate for Environment; Casual; Fairly Groomed  Eye Contact: Fair  Speech: Clear and Coherent  Speech Volume: Normal  Handedness: Right  Mood and Affect  Mood: better  Affect: restricted  Thought Process  Thought Processes: appears organized  Descriptions of Associations:Intact  Orientation:Full (Time, Place and Person)  Thought Content: appears logical today  History of Schizophrenia/Schizoaffective disorder:No  Duration of Psychotic Symptoms:Greater than six months  Hallucinations: denies today  Ideas of Reference: denies today  Suicidal Thoughts:Suicidal Thoughts: No  Homicidal Thoughts:Homicidal Thoughts: No  Sensorium  Memory: Immediate Fair; Recent Fair  Judgment: Fair  Insight: Fair  Chartered certified accountant: Fair  Attention Span: Fair  Recall: Fair  Fund of Knowledge: Fair  Language: Good  Psychomotor Activity  Psychomotor Activity: Psychomotor Activity: Normal  Assets  Assets: Communication Skills; Physical Health; Resilience  Sleep  Sleep: Sleep: Good Number of Hours of Sleep: 8.5  Physical Exam: Physical Exam Vitals and nursing note reviewed.  Constitutional:      General: He is not in acute distress.    Appearance: He is not ill-appearing.  HENT:     Head: Normocephalic.     Right Ear: External ear normal.     Left Ear:  External ear normal.     Nose: Nose normal.     Mouth/Throat:     Mouth: Mucous membranes are moist.     Pharynx: Oropharynx is clear.  Eyes:     Extraocular Movements: Extraocular movements intact.  Cardiovascular:     Rate and Rhythm: Normal rate.     Pulses: Normal pulses.  Pulmonary:     Effort: Pulmonary effort is normal.  Abdominal:     Comments: Deferred   Genitourinary:    Comments: Deferred   Musculoskeletal:        General: Normal range of motion.     Cervical back: Normal range of motion.  Skin:    General: Skin is warm.  Neurological:     General:  No focal deficit present.     Mental Status: He is alert and oriented to person, place, and time.  Psychiatric:        Mood and Affect: Mood normal.        Behavior: Behavior normal.    Review of Systems  Constitutional:  Negative for chills and fever.  HENT:  Negative for sore throat.   Eyes:  Negative for blurred vision.  Respiratory:  Negative for cough, sputum production, shortness of breath and wheezing.   Cardiovascular:  Negative for chest pain and palpitations.  Gastrointestinal:  Negative for abdominal pain, constipation, diarrhea, heartburn, nausea and vomiting.  Genitourinary:  Negative for dysuria.  Musculoskeletal:  Negative for falls.  Skin:  Negative for itching and rash.  Neurological:  Negative for dizziness and headaches.  Endo/Heme/Allergies:        See allergy listing.  Psychiatric/Behavioral:  Positive for depression and hallucinations. Negative for substance abuse and suicidal ideas. The patient is nervous/anxious. The patient does not have insomnia.    Blood pressure 120/83, pulse 65, temperature 98 F (36.7 C), temperature source Oral, resp. rate 16, height 5' 9 (1.753 m), weight 83.4 kg, SpO2 100%. Body mass index is 27.14 kg/m.  Treatment Plan Summary: Daily contact with patient to assess and evaluate symptoms and progress in treatment and Medication management  Patient is a  33 year old male with the above-stated past psychiatric history who is seen in follow-up.  Chart reviewed. Patient discussed with nursing. Patient reports mood improvement, no suicidal thoughts and improvement of psychotic sx after he started on medications.  Diagnoses/ Active problems: -MDD (major depressive disorder), recurrent, severe, with psychosis. -Substance use disorder  PLAN: Safety and Monitoring: continue inpatient psych admission; 15-minute checks; daily contact with patient to assess and evaluate symptoms and progress in treatment; psychoeducation.Vital signs: q12 hours. Precautions: suicide, elopement, and assault. Placed on room lock out for meals, snacks and groups.  Psychiatric Problems: Medications: --continue Prozac  20 mg po daily for depression and anxiety --continue Abilify  5 mg po daily for anti-depression augmentation and psychosis --continue Trazodone  50 mg po prn at bedtime for Insomnia --continue Hydroxyzine  tablets 25 mg po TID prn for anxiety --continue Methadone  (DOLOPHINE ) tablet 120 mg po daily for LT detoxification and maintenance treatment for opioid addiction --Nicotine  patch 21 mg transdermally to 24 hours daily for smoking cessation --Nicotine  gum 2 mg p.o. as needed for smoking cessation.   Other PRN Medications  -Acetaminophen  650 mg every 6 as needed/mild pain  -Maalox 30 mL oral every 4 as needed/digestion  -Magnesium  hydroxide 30 mL daily as needed/mild constipation    Pertinent Labs: no new labs ordered today  Consults: No new consults placed since yesterday    Discharge Planning: -Social work and case management to assist with discharge planning and identification of hospital follow-up needs prior to discharge -Estimated LOS: 3-5 days -Discharge Concerns: Need to establish a safety plan; Medication compliance and effectiveness -Discharge Goals: Return home with outpatient referrals for mental health follow-up including medication  management/psychotherapy  Ellouise JAYSON Azure, FNP 05/03/2024, 12:32 PM Patient ID: Billy Duke, male   DOB: 1991-04-18, 33 y.o.   MRN: [id number]

## 2024-05-03 NOTE — [facility] Suicide Risk Assessment (Signed)
 [facility] INPATIENT:  Family/Significant Other Suicide Prevention Education  Suicide Prevention Education:  Contact Attempts: [name] (ex-wife) [phone], (name of family member/significant other) has been identified by the patient as the family member/significant other with whom the patient will be residing, and identified as the person(s) who will aid the patient in the event of a mental health crisis.    Date and time of first attempt:  05/03/2024 / 5:27 PM  CSW left a voicemail.   Billy Duke, LCSWA 05/03/2024, 5:27 PM

## 2024-05-03 NOTE — [facility] Group Notes (Signed)
 [facility] Group Notes:  (Nursing/MHT/Case Management/Adjunct)  Date:  05/03/2024  Time:  2000  Type of Therapy:  Wrap up group  Participation Level:  Active  Participation Quality:  Appropriate, Attentive, and Sharing  Affect:  Appropriate  Cognitive:  Alert  Insight:  Improving  Engagement in Group:  Developing/Improving  Modes of Intervention:  Clarification, Education, and Support  Summary of Progress/Problems: Positive thinking and positive change were discussed.   Lenora Manuelita RAMAN 05/03/2024, 8:47 PM

## 2024-05-03 NOTE — Progress Notes (Signed)
   05/03/24 2300  Psych Admission Type (Psych Patients Only)  Admission Status Involuntary  Psychosocial Assessment  Patient Complaints Isolation  Eye Contact Fair  Facial Expression Flat  Affect Appropriate to circumstance  Speech Logical/coherent  Interaction Guarded  Motor Activity Slow  Appearance/Hygiene Unremarkable  Behavior Characteristics Appropriate to situation  Mood Depressed  Thought Process  Coherency WDL  Content WDL  Delusions None reported or observed  Perception WDL  Hallucination None reported or observed  Judgment Poor  Confusion None  Danger to Self  Current suicidal ideation? Denies  Agreement Not to Harm Self Yes  Description of Agreement Verbal  Danger to Others  Danger to Others None reported or observed

## 2024-05-03 NOTE — Group Note (Signed)
 Recreation Therapy Group Note   Group Topic:Problem Solving  Group Date: 05/03/2024 Start Time: 0930 End Time: 1000 Facilitators: Kymberlie Brazeau-McCall, LRT,CTRS Location: [address] Dayroom   Group Topic: Problem Solving  Goal Area(s) Addresses:  Patient will effectively work in a team with other group members. Patient will verbalize importance of using appropriate problem solving techniques.  Patient will identify positive change associated with effective problem solving skills.   Behavioral Response:   Intervention: Worksheet, Music  Activity: Science writer. Patients were given a front and back worksheet of brain teasers. Patients worked together to figure out each individual puzzle presented on sheet. Patients had 20 minutes to complete the brain teasers. LRT went over the answers with patients at the end.    Education: Problem Solving, Communication, Cognitive Skills  Education Outcome: Acknowledges understanding/In group clarification offered/Needs additional education.    Affect/Mood: N/A   Participation Level: Did not attend    Clinical Observations/Individualized Feedback:     Plan: Continue to engage patient in RT group sessions 2-3x/week.   Billy Duke, LRT,CTRS  05/03/2024 1:07 PM

## 2024-05-03 NOTE — BH IP Treatment Plan (Signed)
 Interdisciplinary Treatment and Diagnostic Plan Update  05/03/2024 Time of Session: [address] Billy Duke MRN: [id number]  Principal Diagnosis: MDD (major depressive disorder), recurrent, severe, with psychosis (HCC)  Secondary Diagnoses: Principal Problem:   MDD (major depressive disorder), recurrent, severe, with psychosis (HCC)   Current Medications:  Current Facility-Administered Medications  Medication Dose Route Frequency Provider Last Rate Last Admin   acetaminophen  (TYLENOL ) tablet 650 mg  650 mg Oral Q6H PRN Bobbitt, Shalon E, NP   650 mg at 05/01/24 0840   alum & mag hydroxide-simeth (MAALOX/MYLANTA) 200-200-20 MG/5ML suspension 30 mL  30 mL Oral Q4H PRN Bobbitt, Shalon E, NP       [START ON 05/04/2024] ARIPiprazole  (ABILIFY ) tablet 10 mg  10 mg Oral Daily Ntuen, Tina C, FNP       haloperidol  (HALDOL ) tablet 5 mg  5 mg Oral TID PRN Bobbitt, Shalon E, NP       And   diphenhydrAMINE  (BENADRYL ) capsule 50 mg  50 mg Oral TID PRN Bobbitt, Shalon E, NP       haloperidol  lactate (HALDOL ) injection 5 mg  5 mg Intramuscular TID PRN Bobbitt, Shalon E, NP       And   diphenhydrAMINE  (BENADRYL ) injection 50 mg  50 mg Intramuscular TID PRN Bobbitt, Shalon E, NP       And   LORazepam  (ATIVAN ) injection 2 mg  2 mg Intramuscular TID PRN Bobbitt, Shalon E, NP       haloperidol  lactate (HALDOL ) injection 10 mg  10 mg Intramuscular TID PRN Bobbitt, Shalon E, NP       And   diphenhydrAMINE  (BENADRYL ) injection 50 mg  50 mg Intramuscular TID PRN Bobbitt, Shalon E, NP       And   LORazepam  (ATIVAN ) injection 2 mg  2 mg Intramuscular TID PRN Bobbitt, Shalon E, NP       FLUoxetine  (PROZAC ) capsule 20 mg  20 mg Oral Daily Ntuen, Tina C, FNP   20 mg at 05/03/24 9191   hydrOXYzine  (ATARAX ) tablet 25 mg  25 mg Oral TID PRN Bobbitt, Shalon E, NP   25 mg at 05/02/24 0835   magnesium  hydroxide (MILK OF MAGNESIA) suspension 30 mL  30 mL Oral Daily PRN Bobbitt, Shalon E, NP       methadone  (DOLOPHINE )  tablet 120 mg  120 mg Oral Daily Ntuen, Tina C, FNP   120 mg at 05/03/24 9191   nicotine  (NICODERM CQ  - dosed in mg/24 hours) patch 21 mg  21 mg Transdermal Daily Ntuen, Tina C, FNP   21 mg at 05/03/24 0810   nicotine  polacrilex (NICORETTE ) gum 2 mg  2 mg Oral PRN Ntuen, Tina C, FNP       traZODone  (DESYREL ) tablet 50 mg  50 mg Oral QHS PRN Bobbitt, Shalon E, NP   50 mg at 05/02/24 0835   PTA Medications: Medications Prior to Admission  Medication Sig Dispense Refill Last Dose/Taking   methadone  (DOLOPHINE ) 10 MG tablet Take 120 mg by mouth daily.   Taking    Patient Stressors:    Patient Strengths:    Treatment Modalities: Medication Management, Group therapy, Case management,  1 to 1 session with clinician, Psychoeducation, Recreational therapy.   Physician Treatment Plan for Primary Diagnosis: MDD (major depressive disorder), recurrent, severe, with psychosis (HCC) Long Term Goal(s): Improvement in symptoms so as ready for discharge   Short Term Goals: Ability to identify changes in lifestyle to reduce recurrence of condition will improve Ability to  verbalize feelings will improve Ability to disclose and discuss suicidal ideas Ability to demonstrate self-control will improve Ability to identify and develop effective coping behaviors will improve Ability to maintain clinical measurements within normal limits will improve Compliance with prescribed medications will improve Ability to identify triggers associated with substance abuse/mental health issues will improve  Medication Management: Evaluate patient's response, side effects, and tolerance of medication regimen.  Therapeutic Interventions: 1 to 1 sessions, Unit Group sessions and Medication administration.  Evaluation of Outcomes: Not Progressing  Physician Treatment Plan for Secondary Diagnosis: Principal Problem:   MDD (major depressive disorder), recurrent, severe, with psychosis (HCC)  Long Term Goal(s): Improvement  in symptoms so as ready for discharge   Short Term Goals: Ability to identify changes in lifestyle to reduce recurrence of condition will improve Ability to verbalize feelings will improve Ability to disclose and discuss suicidal ideas Ability to demonstrate self-control will improve Ability to identify and develop effective coping behaviors will improve Ability to maintain clinical measurements within normal limits will improve Compliance with prescribed medications will improve Ability to identify triggers associated with substance abuse/mental health issues will improve     Medication Management: Evaluate patient's response, side effects, and tolerance of medication regimen.  Therapeutic Interventions: 1 to 1 sessions, Unit Group sessions and Medication administration.  Evaluation of Outcomes: Not Progressing   RN Treatment Plan for Primary Diagnosis: MDD (major depressive disorder), recurrent, severe, with psychosis (HCC) Long Term Goal(s): Knowledge of disease and therapeutic regimen to maintain health will improve  Short Term Goals: Ability to remain free from injury will improve, Ability to verbalize frustration and anger appropriately will improve, Ability to demonstrate self-control, Ability to participate in decision making will improve, Ability to verbalize feelings will improve, Ability to disclose and discuss suicidal ideas, Ability to identify and develop effective coping behaviors will improve, and Compliance with prescribed medications will improve  Medication Management: RN will administer medications as ordered by provider, will assess and evaluate patient's response and provide education to patient for prescribed medication. RN will report any adverse and/or side effects to prescribing provider.  Therapeutic Interventions: 1 on 1 counseling sessions, Psychoeducation, Medication administration, Evaluate responses to treatment, Monitor vital signs and CBGs as ordered,  Perform/monitor CIWA, COWS, AIMS and Fall Risk screenings as ordered, Perform wound care treatments as ordered.  Evaluation of Outcomes: Not Progressing   LCSW Treatment Plan for Primary Diagnosis: MDD (major depressive disorder), recurrent, severe, with psychosis (HCC) Long Term Goal(s): Safe transition to appropriate next level of care at discharge, Engage patient in therapeutic group addressing interpersonal concerns.  Short Term Goals: Engage patient in aftercare planning with referrals and resources, Increase social support, Increase ability to appropriately verbalize feelings, Increase emotional regulation, Facilitate acceptance of mental health diagnosis and concerns, Facilitate patient progression through stages of change regarding substance use diagnoses and concerns, Identify triggers associated with mental health/substance abuse issues, and Increase skills for wellness and recovery  Therapeutic Interventions: Assess for all discharge needs, 1 to 1 time with Social worker, Explore available resources and support systems, Assess for adequacy in community support network, Educate family and significant other(s) on suicide prevention, Complete Psychosocial Assessment, Interpersonal group therapy.  Evaluation of Outcomes: Not Progressing   Progress in Treatment: Attending groups: Yes. Participating in groups: Yes. Taking medication as prescribed: Yes. Toleration medication: Yes. Family/Significant other contact made: No, will contact:  [employer], ex wife, [phone] Patient understands diagnosis: Yes. Discussing patient identified problems/goals with staff: Yes. Medical problems stabilized or  resolved: Yes. Denies suicidal/homicidal ideation: Yes. Issues/concerns per patient self-inventory: No. Other: n/a  New problem(s) identified: No, Describe:  none  New Short Term/Long Term Goal(s): detox, medication management for mood stabilization; elimination of SI thoughts;  development of comprehensive mental wellness/sobriety plan  Patient Goals:  help get my mind clear  Discharge Plan or Barriers: Patient recently admitted. CSW will continue to follow and assess for appropriate referrals and possible discharge planning.   Reason for Continuation of Hospitalization: Anxiety Depression Medication stabilization Withdrawal symptoms Other; describe mood stabilization, discharge planning  Estimated Length of Stay: 5-7 DAYS  Last 3 [place] Suicide Severity Risk Score: Flowsheet Row Ad[facility] (Current) [facility] from 04/30/2024 in [facility]  C-SSRS RISK CATEGORY Low Risk Low Risk    Last [facility] 2/9 Scores:     No data to display          Scribe for Treatment Team: Taline Nass N Willis Holquin, LCSW 05/03/2024 4:40 PM

## 2024-05-03 NOTE — Progress Notes (Addendum)
 Conversation with patient:  Per patient's request, CSW gave patient contact information for   [facility] [employer] (substance use) [address] [place], [place] [address] [phone]  [facility] [address], [place], [place] [address] [phone]  CSW also gave him a list of [address] in [place].  He declined [facility] because he explained that he wouldn't be accepted because he takes methadone .  CSW gave him a list of shelter resources.    Patient said that he may call these resources after he leaves the hospital.  He said that he is homeless and doesn't mind being discharged to the [facility].   Farmer Mccahill, LCSWA 05/03/2024

## 2024-05-03 NOTE — Group Note (Signed)
 Occupational Therapy Group Note  Group Topic: Sleep Hygiene  Group Date: 05/03/2024 Start Time: 1425 End Time: 1455 Facilitators: Dot [place] MATSU, OT   Group Description: Group encouraged increased participation and engagement through topic focused on sleep hygiene. Patients reflected on the quality of sleep they typically receive and identified areas that need improvement. Group was given background information on sleep and sleep hygiene, including common sleep disorders. Group members also received information on how to improve one's sleep and introduced a sleep diary as a tool that can be utilized to track sleep quality over a length of time. Group session ended with patients identifying one or more strategies they could utilize or implement into their sleep routine in order to improve overall sleep quality.        Therapeutic Goal(s):  Identify one or more strategies to improve overall sleep hygiene  Identify one or more areas of sleep that are negatively impacted (sleep too much, too little, etc)     Participation Level: Engaged   Participation Quality: Independent   Behavior: Appropriate   Speech/Thought Process: Relevant   Affect/Mood: Appropriate   Insight: Fair   Judgement: Fair      Modes of Intervention: Education  Patient Response to Interventions:  Attentive   Plan: Continue to engage patient in OT groups 2 - 3x/week.  05/03/2024  [place] MATSU Dot, OT  Billy Duke, OT

## 2024-05-04 ENCOUNTER — Other Ambulatory Visit: Payer: Self-pay

## 2024-05-04 NOTE — Progress Notes (Signed)
 Conversation with patient:  CSW asked patient if he made any phone calls today, and he said no.  He said that he is ready to be discharged to the [facility].   Diamone Whistler, LCSWA 05/04/2024

## 2024-05-04 NOTE — Discharge Summary (Incomplete)
 Physician Discharge Summary Note  Patient:  Billy Duke is an 33 y.o., male MRN:  [id number] DOB:  01-24-1991 Patient phone:  [phone] (home)  Patient address:   [address] [place] [address],  Total Time spent with patient: 45 minutes  Date of Admission:  05/01/2024 Date of Discharge:   05/05/2024  Reason for Admission:  Ervin Hensley is a 33 year old Caucasian male with prior psychiatric history significant for MDD, recurrent severe, with psychotic features, polysubstance use including fentanyl, cocaine, and amphetamines, and self-reported history of bipolar disorder and schizophrenia.  Patient presents involuntarily to [facility] from [facility] ED at [facility] for polysubstance use intoxication in the context of homelessness.   Principal Problem: MDD (major depressive disorder), recurrent, severe, with psychosis (HCC) Discharge Diagnoses: Principal Problem:   MDD (major depressive disorder), recurrent, severe, with psychosis (HCC)  Past Psychiatric History: Previous Psych Diagnoses: MDD recurrent severe, with psychosis Prior inpatient treatment: Denies prior inpatient hospitalization except for rehab treatment for drugs and alcohol. Current/prior outpatient treatment: Denies Prior rehab hx: X 6, with most recent in [place]  in 2023 for 5 months Psychotherapy hx: Yes History of suicide: Denies history of suicide attempt History of homicide or aggression: Denies Psychiatric medication history: Patient report being on trial of Abilify  in 2023 for psychosis Psychiatric medication compliance history: Noncompliance Neuromodulation history: Denies.  Neuromodulation Current Psychiatrist: Denies Current therapist: Denie  Past Medical History: History reviewed. No pertinent past medical history. History reviewed. No pertinent surgical history. Family History: History reviewed. No pertinent family history. Family Psychiatric  History:  See H&P Social History:  Social History   Substance and Sexual Activity  Alcohol Use Not Currently     Social History   Substance and Sexual Activity  Drug Use Yes   Frequency: 7.0 times per week   Types: Crack cocaine, Amphetamines, Cocaine, Fentanyl, Marijuana, Opium   Comment: Drug use daily    Social History   Socioeconomic History   Marital status: Divorced    Spouse name: Not on file   Number of children: Not on file   Years of education: Not on file   Highest education level: Not on file  Occupational History   Not on file  Tobacco Use   Smoking status: Former    Current packs/day: 0.50    Average packs/day: 0.5 packs/day for 25.5 years (12.8 ttl pk-yrs)    Types: Cigarettes, E-cigarettes    Start date: 2000   Smokeless tobacco: Not on file  Vaping Use   Vaping status: Every Day  Substance and Sexual Activity   Alcohol use: Not Currently   Drug use: Yes    Frequency: 7.0 times per week    Types: Crack cocaine, Amphetamines, Cocaine, Fentanyl, Marijuana, Opium    Comment: Drug use daily   Sexual activity: Not Currently  Other Topics Concern   Not on file  Social History Narrative   Not on file   Social Drivers of Health   Financial Resource Strain: Not on file  Food Insecurity: Food Insecurity Present (05/01/2024)   Hunger Vital Sign    Worried About Running Out of Food in the Last Year: Often true    Ran Out of Food in the Last Year: Often true  Transportation Needs: Unmet Transportation Needs (05/01/2024)   PRAPARE - Administrator, Civil Service (Medical): Yes    Lack of Transportation (Non-Medical): Yes  Physical Activity: Not on file  Stress: Not on file  Social Connections: Not on file   Hospital Course:  During the patient's hospitalization, patient had extensive initial psychiatric evaluation, and follow-up psychiatric evaluations every day.  Psychiatric diagnoses provided upon initial assessment:  Diagnosis:  Principal  Problem:   MDD (major depressive disorder), recurrent, severe, with psychosis (HCC)   Polysubstance use disorder  Patient's psychiatric medications were adjusted on admission:  --Initiate Prozac  20 mg po daily for depression and anxiety --Initiate Abilify  5 mg po daily for anti-depression augmentation and psychosis --Continue Trazodone  50 mg po prn at bedtime for Insomnia --Continue Hydroxyzine  tablets 25 mg po TID prn for anxiety --Continue Methadone  (DOLOPHINE ) tablet 120 mg po daily for LT detoxification and maintenance treatment for opioid addiction --Nicotine  patch 21 mg transdermally to 24 hours daily for smoking cessation --Nicotine  gum 2 mg p.o. as needed for smoking cessation.  During the hospitalization, other adjustments were made to the patient's psychiatric medication regimen:  Abilify  was increased to 10 mg p.o. daily for depression and psychosis  Patient's care was discussed during the interdisciplinary team meeting every day during the hospitalization.  The patient denies having side effects to prescribed psychiatric medication.  Gradually, patient started adjusting to milieu. The patient was evaluated each day by a clinical provider to ascertain response to treatment. Improvement was noted by the patient's report of decreasing symptoms, improved sleep and appetite, affect, medication tolerance, behavior, and participation in unit programming.  Patient was asked each day to complete a self inventory noting mood, mental status, pain, new symptoms, anxiety and concerns.    Symptoms were reported as significantly decreased or resolved completely by discharge.   On day of discharge, the patient reports that their mood is stable. The patient denied having suicidal thoughts for more than 48 hours prior to discharge.  Patient denies having homicidal thoughts.  Patient denies having auditory hallucinations.  Patient denies any visual hallucinations or other symptoms of psychosis. The  patient was motivated to continue taking medication with a goal of continued improvement in mental health.   The patient reports their target psychiatric symptoms of depression and thought disorder responded well to the psychiatric medications, and the patient reports overall benefit other psychiatric hospitalization. Supportive psychotherapy was provided to the patient. The patient also participated in regular group therapy while hospitalized. Coping skills, problem solving as well as relaxation therapies were also part of the unit programming.  Labs were reviewed with the patient, and abnormal results were discussed with the patient.  The patient is able to verbalize their individual safety plan to this provider.  # It is recommended to the patient to continue psychiatric medications as prescribed, after discharge from the hospital.    # It is recommended to the patient to follow up with your outpatient psychiatric provider and PCP.  # It was discussed with the patient, the impact of alcohol, drugs, tobacco have been there overall psychiatric and medical wellbeing, and total abstinence from substance use was recommended the patient.ed.  # Prescriptions provided or sent directly to preferred pharmacy at discharge. Patient agreeable to plan. Given opportunity to ask questions. Appears to feel comfortable with discharge.    # In the event of worsening symptoms, the patient is instructed to call the crisis hotline, 911 and or go to the nearest ED for appropriate evaluation and treatment of symptoms. To follow-up with primary care provider for other medical issues, concerns and or health care needs  # Patient was discharged to the shelter with a plan to follow up as  noted below.   Physical Findings: AIMS:  , ,  ,  ,  ,  ,   CIWA:    COWS:     Musculoskeletal: Strength & Muscle Tone: within normal limits Gait & Station: normal Patient leans: N/A  Psychiatric Specialty Exam:  Presentation   General Appearance:  Casual  Eye Contact: Fair  Speech: Clear and Coherent  Speech Volume: Normal  Handedness: Right  Mood and Affect  Mood: Angry; Anxious; Depressed; Dysphoric  Affect: Congruent  Thought Process  Thought Processes: Coherent  Descriptions of Associations:Intact  Orientation:Full (Time, Place and Person)  Thought Content:Logical  History of Schizophrenia/Schizoaffective disorder:No  Duration of Psychotic Symptoms:Greater than six months  Hallucinations:Hallucinations: None Description of Auditory Hallucinations: Denies Description of Visual Hallucinations: Denies  Ideas of Reference:None  Suicidal Thoughts:Suicidal Thoughts: No  Homicidal Thoughts:Homicidal Thoughts: No  Sensorium  Memory: Immediate Fair  Judgment: Fair  Insight: Fair  Art therapist  Concentration: Fair  Attention Span: Fair  Recall: Fair  Fund of Knowledge: Fair  Language: Good  Psychomotor Activity  Psychomotor Activity: Psychomotor Activity: Normal  Assets  Assets: Communication Skills; Physical Health; Resilience  Sleep  Sleep: Sleep: Good  Estimated Sleeping Duration (Last 24 Hours): 6.00-7.00 hours   Physical Exam: Physical Exam Vitals and nursing note reviewed.  Constitutional:      General: He is not in acute distress.    Appearance: He is not ill-appearing.  HENT:     Head: Normocephalic.     Right Ear: External ear normal.     Left Ear: External ear normal.     Nose: Nose normal.     Mouth/Throat:     Mouth: Mucous membranes are moist.     Pharynx: Oropharynx is clear.  Cardiovascular:     Rate and Rhythm: Normal rate.     Pulses: Normal pulses.  Pulmonary:     Effort: Pulmonary effort is normal. No respiratory distress.  Abdominal:     Comments: Deferred  Genitourinary:    Comments: Deferred Musculoskeletal:        General: Normal range of motion.     Cervical back: Normal range of motion.  Skin:     General: Skin is warm.  Neurological:     General: No focal deficit present.     Mental Status: He is alert and oriented to person, place, and time.  Psychiatric:        Mood and Affect: Mood normal.        Behavior: Behavior normal.    Review of Systems  Constitutional:  Negative for chills and fever.  HENT:  Negative for sore throat.   Eyes:  Negative for blurred vision.  Respiratory:  Negative for cough, sputum production, shortness of breath and wheezing.   Cardiovascular:  Negative for chest pain and palpitations.  Gastrointestinal:  Negative for abdominal pain, constipation, diarrhea, heartburn, nausea and vomiting.  Genitourinary:  Negative for dysuria.  Musculoskeletal:  Negative for falls.  Skin:  Negative for itching and rash.  Neurological:  Negative for dizziness and headaches.  Endo/Heme/Allergies:        See allergy listing  Psychiatric/Behavioral:  Positive for depression (Stable with medications) and substance abuse. Negative for hallucinations and suicidal ideas. The patient is nervous/anxious (Improved with medication). The patient does not have insomnia.    Blood pressure 109/81, pulse 68, temperature 98.3 F (36.8 C), temperature source Oral, resp. rate 15, height 5' 9 (1.753 m), weight 83.4 kg, SpO2 100%. Body mass index is 27.14  kg/m.   Social History   Tobacco Use  Smoking Status Former   Current packs/day: 0.50   Average packs/day: 0.5 packs/day for 25.5 years (12.8 ttl pk-yrs)   Types: Cigarettes, E-cigarettes   Start date: 2000  Smokeless Tobacco Not on file   Tobacco Cessation:  A prescription for an FDA-approved tobacco cessation medication provided at discharge  Blood Alcohol level:  Lab Results  Component Value Date   [facility] <15 04/30/2024   Metabolic Disorder Labs:  Lab Results  Component Value Date   HGBA1C 5.4 05/01/2024   MPG 108 05/01/2024   No results found for: PROLACTIN Lab Results  Component Value Date   CHOL 141 05/01/2024    TRIG 131 05/01/2024   HDL 46 05/01/2024   CHOLHDL 3.1 05/01/2024   VLDL 26 05/01/2024   LDLCALC 69 05/01/2024   See Psychiatric Specialty Exam and Suicide Risk Assessment completed by Attending Physician prior to discharge.  Discharge destination:  Other:  [facility]  Is patient on multiple antipsychotic therapies at discharge:  No   Has Patient had three or more failed trials of antipsychotic monotherapy by history:  No  Recommended Plan for Multiple Antipsychotic Therapies: NA   Follow-up Information     [facility]. Schedule an appointment as soon as possible for a visit.   Specialty: Behavioral Health Why: You have an appointment for therapy services on                           The appointments will be held in person. * YOU MUST CALL BY 7/25 TO CONFIRM THE APPTS TO KEEP THEM. Contact information: [address]  [place] [address] [phone]         [facility] Follow up.   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 Contact information: [address] [place], [place] [address]  Phone: [phone]               Follow-up recommendations:   Discharge Recommendations:    The patient is being discharged to [facility].   Patient is to take his discharge medications as ordered. ?See follow up above.   We recommend that he participates in individual therapy to target uncontrollable agitation and substance abuse.    We recommend that he participates in therapy to target personal conflict, to improve communication skills and conflict resolution skills. Patient is to initiate/implement a contingency based behavioral model to address his behavior.   We recommend that he gets AIMS scale, height, weight, blood pressure, fasting lipid panel, fasting blood sugar in three months from discharge if he's on atypical antipsychotics.    Patient will benefit from monitoring of recurrent suicidal ideation since  patient is on antidepressant medication.   The patient should abstain from all illicit substances and alcohol.   If the patient's symptoms worsen or do not continue to improve or if the patient becomes actively suicidal or homicidal then it is recommended that the patient return to the closest hospital emergency room or call 911 for further evaluation and treatment. National Suicide Prevention Lifeline 1800-SUICIDE or 334-402-2955.   Please follow up with your primary medical doctor for all other medical needs.    The patient has been educated on the possible side effects to medications and she/her guardian is to contact a medical professional and inform outpatient provider of any new side effects of medication.   He is to take  regular diet and activity as tolerated. ?Will benefit from moderate daily exercise.   Patient and Family was educated about removing/locking any firearms, medications or dangerous products from the home.   Activity:  As tolerated   Diet:  Regular Diet   Signed:  Ellouise JAYSON Azure, FNP 05/04/2024, 5:37 PM

## 2024-05-04 NOTE — [facility] Suicide Risk Assessment (Signed)
 [facility] INPATIENT:  Family/Significant Other Suicide Prevention Education  Suicide Prevention Education:  Contact Attempts: Amber Wambolt (ex-wife) [phone], (name of family member/significant other) has been identified by the patient as the family member/significant other with whom the patient will be residing, and identified as the person(s) who will aid the patient in the event of a mental health crisis.     Date and time of second attempt:05/04/2024 / 4:03 PM   Billy Duke, LCSWA 05/04/2024, 4:03 PM

## 2024-05-04 NOTE — Progress Notes (Incomplete)
 Patient is not motivated towards addiction treatment.  He is not interested in group activity.  He is more focused on being discharged.  He is not exhibiting any psychotic symptoms.  He is not endorsing any rageful thoughts towards himself or towards others.  He has tolerated recent optimization in dose of aripiprazole .  We will keep his medicine the same and evaluate him further.  Hopeful discharge in a day or two if he maintains stability.

## 2024-05-04 NOTE — Plan of Care (Signed)
   Problem: Education: Goal: Emotional status will improve Outcome: Progressing Goal: Mental status will improve Outcome: Progressing   Problem: Activity: Goal: Interest or engagement in activities will improve Outcome: Progressing

## 2024-05-04 NOTE — BH Assessment (Signed)
(  Sleep Hours) -9.5 as of 0530 (Any PRNs that were needed, meds refused, or side effects to meds)- no meds given this shift (Any disturbances and when (visitation, over night)-no disturbances this shift, pt was woken up for wrap-up group but refused to attend. (Concerns raised by the patient)- none (SI/HI/AVH)- denies all

## 2024-05-04 NOTE — Progress Notes (Signed)
   05/04/24 0515  15 Minute Checks  Location Bedroom  Visual Appearance Calm  Behavior Sleeping  Sleep (Behavioral Health Patients Only)  Calculate sleep? (Click Yes once per 24 hr at 0600 safety check) Yes  Documented sleep last 24 hours 7.75

## 2024-05-04 NOTE — Group Note (Signed)
 LCSW Group Therapy Note   Group Date: 05/04/2024 Start Time: 1100 End Time: 1200   Participation:  patient was present.  He listened and was respectful but didn't participate in the group session.  Type of Therapy:  Group Therapy  Topic:  Healing Hearts: A Safe Space for Grief  Objective:  The objective of this group, Healing Hearts: A Safe Space for Grief, is to create a compassionate environment where participants can process their grief, explore different stages of grief, and discover ways to honor their loved ones through personal rituals.  3 Goals: Provide a safe and supportive space where participants feel comfortable sharing their feelings and experiences of grief without judgment. Educate participants about the stages of grief and emphasize that there is no right way to grieve or a fixed timeline for healing. Introduce the concept of rituals as a means to process grief, allowing individuals to honor their loved ones in a personal and meaningful way.  Summary:  In Healing Hearts: A Safe Space for Grief, we explored the unique and personal journey of grief, emphasizing that everyone experiences it differently. We discussed the five stages of grief (denial, anger, bargaining, depression, and acceptance), with the understanding that grief is not linear. Rituals were introduced as a way to help cope with loss, offering comfort and connection through meaningful actions such as lighting candles or taking memory walks. Participants were encouraged to express their emotions, focus on self-care, and reflect on moments of gratitude for their loved ones, recognizing that healing is a process and there is no timeline for grief.   Laneka Mcgrory O Bre Pecina, LCSWA 05/04/2024  5:32 PM

## 2024-05-04 NOTE — Progress Notes (Signed)
 Patient noted to be irritable this AM, requesting to be discharge. Pt able to be verbally deescalated. Pt medication and group compliant, observed to be interacting well with peers. Appetite good on shift. Safety maintained.

## 2024-05-04 NOTE — Progress Notes (Cosign Needed Addendum)
 [facility]  Progress Note  05/04/2024 3:09 PM Polk Minor  MRN:  [id number]  Principal Problem: MDD (major depressive disorder), recurrent, severe, with psychosis (HCC) Diagnosis: Principal Problem:   MDD (major depressive disorder), recurrent, severe, with psychosis (HCC)  Reason for admission:  A 33 year old Caucasian male with prior psychiatric history significant for MDD, recurrent severe, with psychotic features, polysubstance use including fentanyl, cocaine, and amphetamines, and self-reported history of bipolar disorder and schizophrenia, admitted for suicidal ideations in settings of polysubstance use and homelessness.   24-hour chart review: Patient was seen today for re-evaluation.  Patient Case discussed with interdisciplinary team.  Nursing reports no events overnight. The patient has no issues with performing ADLs.  Patient has been medication compliant.  Patient discussed in interdisciplinary team meeting.  Vital signs without critical values.  No agitation protocol required  Today's assessment notes:   On assessment today, the pt reports that his mood is ok, improved since admission, and stable. Denies feeling down, depressed, or sad. Reports that anxiety symptoms are at manageable level.  However, when asked to attend therapeutic milieu and unit group activities, the patient became very agitated with use of foul language.  Then, patient was observed to get up quickly and went to group.  No further discomfort observed.  He denies SI, HI, or AVH.  Compliance with psychotropic medications.  No changes in his treatment regimen.  Patient does not currently exhibit motivation for drug rehab treatment, does not show any interest in attending groups and therapeutic milieu.  Uses foul language with nursing staff encouraged to attend therapeutic milieu.  Focuses on being discharged.  No changes in his treatment regimen today.  Estimated date of discharge 05/05/2024. Sleep is stable. Appetite is  stable.  Concentration is without complaint.  Energy level is adequate. Denies having any suicidal thoughts. Denies having any suicidal intent and plan.  Denies having any HI.  Denies having psychotic symptoms.   Denies having side effects to current psychiatric medications.   Discussed discharge planning: How to identify the signs of impending crisis, use of internal coping strategies, reaching out to friends and family that can help navigate a crisis, and a list of mental health professionals and agencies to call. Further to follow up on her mental health appointments and her PCP appointments.   Labs: no new results for review.  Total Time spent with patient: 45 minutes  Past Psychiatric History: see H&P  Past Medical History: History reviewed. No pertinent past medical history. History reviewed. No pertinent surgical history. Family History: History reviewed. No pertinent family history. Family Psychiatric  History: see H&P Social History:  Social History   Substance and Sexual Activity  Alcohol Use Not Currently     Social History   Substance and Sexual Activity  Drug Use Yes   Frequency: 7.0 times per week   Types: Crack cocaine, Amphetamines, Cocaine, Fentanyl, Marijuana, Opium   Comment: Drug use daily    Social History   Socioeconomic History   Marital status: Divorced    Spouse name: Not on file   Number of children: Not on file   Years of education: Not on file   Highest education level: Not on file  Occupational History   Not on file  Tobacco Use   Smoking status: Former    Current packs/day: 0.50    Average packs/day: 0.5 packs/day for 25.5 years (12.8 ttl pk-yrs)    Types: Cigarettes, E-cigarettes    Start date: 2000   Smokeless tobacco:  Not on file  Vaping Use   Vaping status: Every Day  Substance and Sexual Activity   Alcohol use: Not Currently   Drug use: Yes    Frequency: 7.0 times per week    Types: Crack cocaine, Amphetamines, Cocaine,  Fentanyl, Marijuana, Opium    Comment: Drug use daily   Sexual activity: Not Currently  Other Topics Concern   Not on file  Social History Narrative   Not on file   Social Drivers of Health   Financial Resource Strain: Not on file  Food Insecurity: Food Insecurity Present (05/01/2024)   Hunger Vital Sign    Worried About Running Out of Food in the Last Year: Often true    Ran Out of Food in the Last Year: Often true  Transportation Needs: Unmet Transportation Needs (05/01/2024)   PRAPARE - Administrator, Civil Service (Medical): Yes    Lack of Transportation (Non-Medical): Yes  Physical Activity: Not on file  Stress: Not on file  Social Connections: Not on file   Additional Social History:   Sleep: Good Estimated Sleeping Duration (Last 24 Hours): 5.50-6.50 hours  Appetite:  Good  Current Medications: Current Facility-Administered Medications  Medication Dose Route Frequency Provider Last Rate Last Admin   acetaminophen  (TYLENOL ) tablet 650 mg  650 mg Oral Q6H PRN Bobbitt, Shalon E, NP   650 mg at 05/01/24 0840   alum & mag hydroxide-simeth (MAALOX/MYLANTA) 200-200-20 MG/5ML suspension 30 mL  30 mL Oral Q4H PRN Bobbitt, Shalon E, NP       ARIPiprazole  (ABILIFY ) tablet 10 mg  10 mg Oral Daily Cierra Rothgeb C, FNP   10 mg at 05/04/24 0857   haloperidol  (HALDOL ) tablet 5 mg  5 mg Oral TID PRN Bobbitt, Shalon E, NP       And   diphenhydrAMINE  (BENADRYL ) capsule 50 mg  50 mg Oral TID PRN Bobbitt, Shalon E, NP       haloperidol  lactate (HALDOL ) injection 5 mg  5 mg Intramuscular TID PRN Bobbitt, Shalon E, NP       And   diphenhydrAMINE  (BENADRYL ) injection 50 mg  50 mg Intramuscular TID PRN Bobbitt, Shalon E, NP       And   LORazepam  (ATIVAN ) injection 2 mg  2 mg Intramuscular TID PRN Bobbitt, Shalon E, NP       haloperidol  lactate (HALDOL ) injection 10 mg  10 mg Intramuscular TID PRN Bobbitt, Shalon E, NP       And   diphenhydrAMINE  (BENADRYL ) injection 50 mg  50 mg  Intramuscular TID PRN Bobbitt, Shalon E, NP       And   LORazepam  (ATIVAN ) injection 2 mg  2 mg Intramuscular TID PRN Bobbitt, Shalon E, NP       FLUoxetine  (PROZAC ) capsule 20 mg  20 mg Oral Daily Suella Cogar C, FNP   20 mg at 05/04/24 0857   hydrOXYzine  (ATARAX ) tablet 25 mg  25 mg Oral TID PRN Bobbitt, Shalon E, NP   25 mg at 05/02/24 0835   magnesium  hydroxide (MILK OF MAGNESIA) suspension 30 mL  30 mL Oral Daily PRN Bobbitt, Shalon E, NP       methadone  (DOLOPHINE ) tablet 120 mg  120 mg Oral Daily Jazzman Loughmiller C, FNP   120 mg at 05/04/24 0857   nicotine  (NICODERM CQ  - dosed in mg/24 hours) patch 21 mg  21 mg Transdermal Daily Novella Abraha C, FNP   21 mg at 05/04/24 1045   nicotine   polacrilex (NICORETTE ) gum 2 mg  2 mg Oral PRN Livi Mcgann C, FNP       traZODone  (DESYREL ) tablet 50 mg  50 mg Oral QHS PRN Bobbitt, Shalon E, NP   50 mg at 05/02/24 9164   Lab Results:  No results found for this or any previous visit (from the past 48 hours).  Blood Alcohol level:  Lab Results  Component Value Date   [facility] <15 04/30/2024   Metabolic Disorder Labs: Lab Results  Component Value Date   HGBA1C 5.4 05/01/2024   MPG 108 05/01/2024   No results found for: PROLACTIN Lab Results  Component Value Date   CHOL 141 05/01/2024   TRIG 131 05/01/2024   HDL 46 05/01/2024   CHOLHDL 3.1 05/01/2024   VLDL 26 05/01/2024   LDLCALC 69 05/01/2024   Physical Findings: AIMS:  ,  ,  ,  ,  ,  ,   CIWA:    COWS:     Musculoskeletal: Strength & Muscle Tone: within normal limits Gait & Station: normal Patient leans: N/A  Psychiatric Specialty Exam:  Presentation  General Appearance:  Casual  Eye Contact: Fair  Speech: Clear and Coherent  Speech Volume: Normal  Handedness: Right  Mood and Affect  Mood: better  Affect: restricted  Thought Process  Thought Processes: appears organized  Descriptions of Associations:Intact  Orientation:Full (Time, Place and Person)  Thought  Content: appears logical today  History of Schizophrenia/Schizoaffective disorder:No  Duration of Psychotic Symptoms:Greater than six months  Hallucinations: denies today  Ideas of Reference: denies today  Suicidal Thoughts:Suicidal Thoughts: No  Homicidal Thoughts:Homicidal Thoughts: No  Sensorium  Memory: Immediate Fair  Judgment: Fair  Insight: Fair  Art therapist  Concentration: Fair  Attention Span: Fair  Recall: Fair  Fund of Knowledge: Fair  Language: Good  Psychomotor Activity  Psychomotor Activity: Psychomotor Activity: Normal  Assets  Assets: Communication Skills; Physical Health; Resilience  Sleep  Sleep: Sleep: Good Number of Hours of Sleep: 7.75  Physical Exam: Physical Exam Vitals and nursing note reviewed.  Constitutional:      General: He is not in acute distress.    Appearance: He is not ill-appearing.  HENT:     Head: Normocephalic.     Right Ear: External ear normal.     Left Ear: External ear normal.     Nose: Nose normal.     Mouth/Throat:     Mouth: Mucous membranes are moist.     Pharynx: Oropharynx is clear.  Eyes:     Extraocular Movements: Extraocular movements intact.  Cardiovascular:     Rate and Rhythm: Normal rate.     Pulses: Normal pulses.  Pulmonary:     Effort: Pulmonary effort is normal.  Abdominal:     Comments: Deferred   Genitourinary:    Comments: Deferred   Musculoskeletal:        General: Normal range of motion.     Cervical back: Normal range of motion.  Skin:    General: Skin is warm.  Neurological:     General: No focal deficit present.     Mental Status: He is alert and oriented to person, place, and time.  Psychiatric:        Mood and Affect: Mood normal.        Behavior: Behavior normal.    Review of Systems  Constitutional:  Negative for chills and fever.  HENT:  Negative for sore throat.   Eyes:  Negative for blurred vision.  Respiratory:  Negative for cough, sputum  production, shortness of breath and wheezing.   Cardiovascular:  Negative for chest pain and palpitations.  Gastrointestinal:  Negative for abdominal pain, constipation, diarrhea, heartburn, nausea and vomiting.  Genitourinary:  Negative for dysuria.  Musculoskeletal:  Negative for falls.  Skin:  Negative for itching and rash.  Neurological:  Negative for dizziness and headaches.  Endo/Heme/Allergies:        See allergy listing.  Psychiatric/Behavioral:  Positive for depression. Negative for hallucinations, substance abuse and suicidal ideas. The patient is nervous/anxious. The patient does not have insomnia.    Blood pressure 126/88, pulse 80, temperature 98.2 F (36.8 C), temperature source Oral, resp. rate 16, height 5' 9 (1.753 m), weight 83.4 kg, SpO2 100%. Body mass index is 27.14 kg/m.  Treatment Plan Summary: Daily contact with patient to assess and evaluate symptoms and progress in treatment and Medication management  Patient is a 33 year old male with the above-stated past psychiatric history who is seen in follow-up.  Chart reviewed. Patient discussed with nursing. Patient reports mood improvement, no suicidal thoughts and improvement of psychotic sx after he started on medications.  Diagnoses/ Active problems: -MDD (major depressive disorder), recurrent, severe, with psychosis. -Substance use disorder  PLAN: Safety and Monitoring: continue inpatient psych admission; 15-minute checks; daily contact with patient to assess and evaluate symptoms and progress in treatment; psychoeducation.Vital signs: q12 hours. Precautions: suicide, elopement, and assault. Placed on room lock out for meals, snacks and groups.  Psychiatric Problems: Medications: --continue Prozac  20 mg po daily for depression and anxiety --continue Abilify  10 mg po daily for anti-depression augmentation and psychosis --continue Trazodone  50 mg po prn at bedtime for Insomnia --continue Hydroxyzine  tablets 25  mg po TID prn for anxiety --continue Methadone  (DOLOPHINE ) tablet 120 mg po daily for LT detoxification and maintenance treatment for opioid addiction --Nicotine  patch 21 mg transdermally to 24 hours daily for smoking cessation --Nicotine  gum 2 mg p.o. as needed for smoking cessation.   Other PRN Medications  -Acetaminophen  650 mg every 6 as needed/mild pain  -Maalox 30 mL oral every 4 as needed/digestion  -Magnesium  hydroxide 30 mL daily as needed/mild constipation    Pertinent Labs: no new labs ordered today  Consults: No new consults placed since yesterday    Discharge Planning: -Social work and case management to assist with discharge planning and identification of hospital follow-up needs prior to discharge -Estimated LOS: 3-5 days -Discharge Concerns: Need to establish a safety plan; Medication compliance and effectiveness -Discharge Goals: Return home with outpatient referrals for mental health follow-up including medication management/psychotherapy  Ellouise JAYSON Azure, FNP 05/04/2024, 3:09 PM Patient ID: Billy Duke, male   DOB: [birth year], 33 y.o.   MRN: [id number] Patient ID: Edin Kon, male   DOB: [birth year], 33 y.o.   MRN: [id number]

## 2024-05-04 NOTE — BHH Group Notes (Signed)
Patient did not attend the Wrap-up group. 

## 2024-05-04 NOTE — BHH Group Notes (Signed)

## 2024-05-04 NOTE — Group Note (Signed)
 Recreation Therapy Group Note   Group Topic:Communication  Group Date: 05/04/2024 Start Time: 0947 End Time: 1025 Facilitators: Asti Mackley-McCall, LRT,CTRS Location: [address] Dayroom   Group Topic: Communication, Problem Solving   Goal Area(s) Addresses:  Patient will effectively listen to complete activity.  Patient will identify communication skills used to make activity successful.  Patient will identify how skills used during activity can be used to reach post d/c goals.    Behavioral Response: None   Intervention: Building surveyor Activity - Geometric pattern cards, pencils, blank paper    Activity: Geometric Drawings.  Three volunteers from the peer group will be shown an abstract picture with a particular arrangement of geometrical shapes.  Each round, one 'speaker' will describe the pattern, as accurately as possible without revealing the image to the group.  The remaining group members will listen and draw the picture to reflect how it is described to them. Patients with the role of 'listener' cannot ask clarifying questions but, may request that the speaker repeat a direction. Once the drawings are complete, the presenter will show the rest of the group the picture and compare how close each person came to drawing the picture. LRT will facilitate a post-activity discussion regarding effective communication and the importance of planning, listening, and asking for clarification in daily interactions with others.  Education: Environmental consultant, Active listening, Support systems, Discharge planning  Education Outcome: Acknowledges understanding/In group clarification offered/Needs additional education.    Affect/Mood: Appropriate and Labile   Participation Level: None   Participation Quality: Independent   Behavior: Agitated and Appropriate   Speech/Thought Process: Relevant   Insight: None   Judgement: None   Modes of Intervention: Activity   Patient  Response to Interventions:  Attentive   Education Outcome:  In group clarification offered    Clinical Observations/Individualized Feedback: Pt came into group agitated and upset. Pt didn't participate in activity. Pt was observing peers as they participated. Pt grew brighter and group went on. By the end of group, pt was smiling and engaging with LRT and peers.     Plan: Continue to engage patient in RT group sessions 2-3x/week.   Lukis Bunt-McCall, LRT,CTRS 05/04/2024 1:14 PM

## 2024-05-04 NOTE — Plan of Care (Signed)

## 2024-05-04 NOTE — Plan of Care (Signed)
  Problem: Safety: Goal: Periods of time without injury will increase Outcome: Progressing   Problem: Activity: Goal: Interest or engagement in activities will improve Outcome: Progressing Goal: Sleeping patterns will improve Outcome: Progressing   Problem: Education: Goal: Mental status will improve Outcome: Progressing

## 2024-05-04 NOTE — Progress Notes (Signed)
 Pt did not attend group.

## 2024-05-05 ENCOUNTER — Encounter (HOSPITAL_COMMUNITY): Payer: Self-pay | Admitting: Emergency Medicine

## 2024-05-05 DIAGNOSIS — F333 Major depressive disorder, recurrent, severe with psychotic symptoms: Principal | ICD-10-CM

## 2024-05-05 MED ORDER — NICOTINE 21 MG/24HR TD PT24: 21.0000 mg | MEDICATED_PATCH | Freq: Every day | TRANSDERMAL | 0 refills | Status: DC

## 2024-05-05 MED ORDER — TRAZODONE HCL 50 MG PO TABS: 50.0000 mg | ORAL_TABLET | Freq: Every evening | ORAL | 0 refills | Status: DC | PRN

## 2024-05-05 MED ORDER — ARIPIPRAZOLE 10 MG PO TABS: 10.0000 mg | ORAL_TABLET | Freq: Every day | ORAL | 0 refills | Status: DC

## 2024-05-05 MED ORDER — FLUOXETINE HCL 20 MG PO CAPS: 20.0000 mg | ORAL_CAPSULE | Freq: Every day | ORAL | 3 refills | Status: DC

## 2024-05-05 MED ORDER — NICOTINE POLACRILEX 2 MG MT GUM: 2.0000 mg | CHEWING_GUM | OROMUCOSAL | 0 refills | Status: DC | PRN

## 2024-05-05 MED ORDER — HYDROXYZINE HCL 25 MG PO TABS: 25.0000 mg | ORAL_TABLET | Freq: Three times a day (TID) | ORAL | 0 refills | Status: DC | PRN

## 2024-05-05 NOTE — Transportation (Signed)
 05/05/2024  Billy Duke DOB: 1991/10/01 MRN: [id number]   RIDER WAIVER AND RELEASE OF LIABILITY  For the purposes of helping with transportation needs, [facility] partners with outside transportation providers (taxi companies, [employer], Catering manager.) to give [place] patients or other approved people the choice of on-demand rides Public librarian) to our buildings for non-emergency visits.  By using [employer], I, the person signing this document, on behalf of myself and/or any legal minors (in my care using the [employer]), agree:  Science writer given to me are supplied by independent, outside transportation providers who do not work for, or have any affiliation with, [employer]. [place] is not a transportation company. [facility] has no control over the quality or safety of the rides I get using [employer]. [place] has no control over whether any outside ride will happen on time or not. [facility] gives no guarantee on the reliability, quality, safety, or availability on any rides, or that no mistakes will happen. I know and accept that traveling by vehicle (car, truck, SVU, fleeta, bus, taxi, etc.) has risks of serious injuries such as disability, being paralyzed, and death. I know and agree the risk of using [employer] is mine alone, and not [employer]. [employer] are provided as is and as are available. The transportation providers are in charge for all inspections and care of the vehicles used to provide these rides. I agree not to take legal action against [facility], its agents, employees, officers, directors, representatives, insurers, attorneys, assigns, successors, subsidiaries, and affiliates at any time for any reasons related directly or indirectly to using [employer]. I also agree not to take legal action against [facility] or its affiliates for any injury, death, or damage to property caused by or related to  using [employer]. I have read this Waiver and Release of Liability, and I understand the terms used in it and their legal meaning. This Waiver is freely and voluntarily given with the understanding that my right (or any legal minors) to legal action against [facility] relating to [employer] is knowingly given up to use these services.   I attest that I read the Ride Waiver and Release of Liability to [name], gave Mr. Bartles the opportunity to ask questions and answered the questions asked (if any). I affirm that Billy Lin Palos then provided consent for assistance with transportation.    Cheryle Dark, LCSWA 05/05/2024

## 2024-05-05 NOTE — BHH Group Notes (Signed)
 The focus of this group is to help patients establish daily goals to achieve during treatment and discuss how the patient can incorporate goal setting into their daily lives to aide in recovery.        Scale 1-10:   6    Goals: Discharge

## 2024-05-05 NOTE — BHH Suicide Risk Assessment (Signed)
 [facility] INPATIENT:  Family/Significant Other Suicide Prevention Education  Suicide Prevention Education:  Education Completed; with patient,  (name of family member/significant other) has been identified by the patient as the family member/significant other with whom the patient will be residing, and identified as the person(s) who will aid the patient in the event of a mental health crisis (suicidal ideations/suicide attempt).  With written consent from the patient, the family member/significant other has been provided the following suicide prevention education, prior to the and/or following the discharge of the patient.  Patient was given Suicide Prevention Information brochure.  The suicide prevention education provided includes the following: Suicide risk factors Suicide prevention and interventions National Suicide Hotline telephone number [facility] assessment telephone number [facility] Emergency Assistance 911 [facility] and/or Residential Mobile Crisis Unit telephone number  Request made of family/significant other to: Remove weapons (e.g., guns, rifles, knives), all items previously/currently identified as safety concern.   Remove drugs/medications (over-the-counter, prescriptions, illicit drugs), all items previously/currently identified as a safety concern.  The family member/significant other verbalizes understanding of the suicide prevention education information provided.  The family member/significant other agrees to remove the items of safety concern listed above.  Jassica Zazueta O Amayrany Cafaro, LCSWA 05/05/2024, 10:17 AM

## 2024-05-05 NOTE — Progress Notes (Signed)
 Patient discharged to home via taxi. Discharge instructions, prescriptions, filled medications and information about follow-up appointment given to pt with verbalization of understanding. All personal belongings returned to pt at time of discharge. Pt escorted to lobby by RN at 1335.  05/05/24 0955  Psych Admission Type (Psych Patients Only)  Admission Status Voluntary  Psychosocial Assessment  Patient Complaints Anxiety  Eye Contact Fair  Facial Expression Flat  Affect Flat  Speech Logical/coherent  Interaction Assertive  Motor Activity Other (Comment) (WNL)  Appearance/Hygiene Improved  Behavior Characteristics Appropriate to situation  Mood Anxious  Thought Process  Coherency WDL  Content WDL  Delusions None reported or observed  Perception WDL  Hallucination None reported or observed  Judgment Impaired  Confusion None  Danger to Self  Current suicidal ideation? Denies  Self-Injurious Behavior No self-injurious ideation or behavior indicators observed or expressed   Agreement Not to Harm Self Yes  Description of Agreement Verbal  Danger to Others  Danger to Others None reported or observed

## 2024-05-05 NOTE — Progress Notes (Addendum)
  [facility] Adult Case Management Discharge Plan :  Will you be returning to the same living situation after discharge:  Yes At discharge, do you have transportation home?: Yes,  CSW arranged transportation through [facility] for 1 PM Do you have the ability to pay for your medications: No.  Release of information consent forms completed and in the chart;  Patient's signature needed at discharge.  Patient to Follow up at:  Follow-up Information     [facility]. Go on 05/10/2024.   Specialty: Behavioral Health Why: You have an appointment for therapy services on 05/10/24 at 4:00 pm.  You have an appointment for medication management services on 05/18/24 at 11:30 am.  The appointments will be held in person. * YOU MUST CALL BY 05/07/24 TO CONFIRM THE APPTS TO KEEP THEM. Contact information: [address] [place] [address] [phone]         [facility] Follow up.   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 Contact information: [address], [place], [place] [address]  Phone: [phone]                Next level of care provider has access to [facility] Link:no  Safety Planning and Suicide Prevention discussed: Yes,  with patient  Has patient been referred to the Quitline?: Patient refused referral for treatment.   Patient has been referred for addiction treatment:  At admission, patient tested positive for amphetamines and marijuana.  A few days ago CSW gave him substance use resources.  He wasn't open to working on it in the hospital, but said that he will quit using substances in the future.  Stages of change: contemplation.  Mylisa Brunson O Pascale Maves, LCSWA 05/05/2024, 10:25 AM

## 2024-05-05 NOTE — Plan of Care (Signed)
  Problem: Coping: Goal: Ability to verbalize frustrations and anger appropriately will improve Outcome: Progressing Goal: Ability to demonstrate self-control will improve Outcome: Progressing   Problem: Activity: Goal: Interest or engagement in activities will improve Outcome: Progressing Goal: Sleeping patterns will improve Outcome: Progressing   Problem: Health Behavior/Discharge Planning: Goal: Identification of resources available to assist in meeting health care needs will improve Outcome: Progressing Goal: Compliance with treatment plan for underlying cause of condition will improve Outcome: Progressing   Problem: Safety: Goal: Periods of time without injury will increase Outcome: Progressing

## 2024-05-05 NOTE — [facility] Suicide Risk Assessment (Signed)
 [facility] Discharge Suicide Risk Assessment   Principal Problem: MDD (major depressive disorder), recurrent, severe, with psychosis (HCC) Discharge Diagnoses: Principal Problem:   MDD (major depressive disorder), recurrent, severe, with psychosis (HCC)   Total Time spent with patient: 30 minutes  Musculoskeletal: Strength & Muscle Tone: within normal limits Gait & Station: normal Patient leans: N/A  Psychiatric Specialty Exam  Presentation  General Appearance and behavior:  Casually dressed, not in any distress, appropriate behavior, moderate engagement.  No EPS.  Eye Contact: Good.  Speech: Spontaneous.  Normal rate, tone and volume.  Normal prosody of speech.  Mood and Affect  Mood: Euthymic.  Affect: Strict and and appropriate.  Thought Process  Thought Processes: Linear and goal directed.  Descriptions of Associations:Intact  Orientation:Full (Time, Place and Person)  Thought Content: Future oriented.  No current suicidal thoughts.  No homicidal thoughts.  No thoughts of violence.  No negative ruminative flooding.  No guilty ruminations.  No delusional theme.  No obsessions.  Hallucinations: No hallucination in any modality.  Sensorium  Memory: Good.  Judgment: Good.  Insight: Limited as patient is not fully motivated to deal with his addiction.  Executive Functions  Concentration: Good.  Attention Span: Good.  Recall: Good.  Fund of Knowledge: Good.  Language: Good   Psychomotor Activity  Normal psychomotor activity   Physical Exam: Physical Exam ROS Blood pressure 119/76, pulse 65, temperature 98.3 F (36.8 C), temperature source Oral, resp. rate 16, height 5' 9 (1.753 m), weight 83.4 kg, SpO2 100%. Body mass index is 27.14 kg/m.  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  No residual psychotic symptoms.  No manic symptoms.  No evidence of depression.  No overwhelming anxiety.  Cognitive Features That Contribute To Risk:  None    Suicide Risk:  Minimal: No current suicidal thoughts.  No current homicidal thoughts.  No current thoughts of violence.  Modifiable risk factor targeted during this admission as mood disorder and substance-induced mood disorder.  No residual symptoms of intoxication.  Patient has responded well to recent adjustments in his mood stabilizer and antidepressant.  Not fully motivated towards addiction treatment.  Patient is aware of risk of accidental overdose on previously tolerated opioids. At this point in time, patient is stable for care at the lower setting.  Follow-up Information     [facility]. Go on 05/10/2024.   Specialty: Behavioral Health Why: You have an appointment for therapy services on 05/10/24 at 4:00 pm.  You have an appointment for medication management services on 05/18/24 at 11:30 am.  The appointments will be held in person. * YOU MUST CALL BY 05/07/24 TO CONFIRM THE APPTS TO KEEP THEM. Contact information: [address] [place] [address] [phone]         [facility] Follow up.   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 Contact information: [address] [place], [place] [address]  Phone: [phone]                Plan Of  Care/Follow-up recommendations:  See discharge summary  Jerrell DELENA Forehand, MD 05/05/2024, 9:55 AM

## 2024-05-05 NOTE — Group Note (Signed)
 Recreation Therapy Group Note   Group Topic:Team Building  Group Date: 05/05/2024 Start Time: 9062 End Time: 1015 Facilitators: Alberta Lenhard-McCall, LRT,CTRS Location: [address] Dayroom   Group Topic: Communication, Team Building, Problem Solving  Goal Area(s) Addresses:  Patient will effectively work with peer towards shared goal.  Patient will identify skills used to make activity successful.  Patient will share challenges and verbalize solution-driven approaches used. Patient will identify how skills used during activity can be used to reach post d/c goals.   Behavioral Response: Engaged  Intervention: STEM Activity   Activity: [employer]. Patients were provided the following materials: 5 drinking straws, 5 rubber bands, 5 paper clips, 2 index cards and 2 drinking cups. Using the provided materials patients were asked to build a launching mechanism to launch a ping pong ball across the room, approximately 10 feet. Patients were divided into teams of 3-5. Instructions required all materials be incorporated into the device, functionality of items left to the peer group's discretion.  Education: Pharmacist, community, Scientist, physiological, Air cabin crew, Building control surveyor.   Education Outcome: Acknowledges education/In group clarification offered/Needs additional education.    Affect/Mood: Appropriate   Participation Level: Engaged   Participation Quality: Independent   Behavior: Appropriate   Speech/Thought Process: Focused   Insight: Good   Judgement: Good   Modes of Intervention: STEM Activity   Patient Response to Interventions:  Engaged   Education Outcome:  In group clarification offered    Clinical Observations/Individualized Feedback: Pt was engaged and worked well with peers in Airline pilot. Pt was intricate in forming/shaping what their launcher would look like and how it would function.    Plan: Continue to engage patient in RT group sessions  2-3x/week.   Alonie Gazzola-McCall, LRT,CTRS 05/05/2024 1:15 PM

## 2024-07-21 ENCOUNTER — Ambulatory Visit (HOSPITAL_COMMUNITY)
Admission: EM | Admit: 2024-07-21 | Discharge: 2024-07-22 | Disposition: A | Discharge status: Home / Self Care | Payer: PRIVATE HEALTH INSURANCE

## 2024-07-21 DIAGNOSIS — F1110 Opioid abuse, uncomplicated: Secondary | ICD-10-CM | POA: Insufficient documentation

## 2024-07-21 DIAGNOSIS — F1914 Other psychoactive substance abuse with psychoactive substance-induced mood disorder: Secondary | ICD-10-CM | POA: Insufficient documentation

## 2024-07-21 DIAGNOSIS — F1410 Cocaine abuse, uncomplicated: Secondary | ICD-10-CM

## 2024-07-21 DIAGNOSIS — F1510 Other stimulant abuse, uncomplicated: Secondary | ICD-10-CM | POA: Insufficient documentation

## 2024-07-21 DIAGNOSIS — F902 Attention-deficit hyperactivity disorder, combined type: Secondary | ICD-10-CM

## 2024-07-21 DIAGNOSIS — F1994 Other psychoactive substance use, unspecified with psychoactive substance-induced mood disorder: Secondary | ICD-10-CM | POA: Diagnosis not present

## 2024-07-21 LAB — CBC WITH DIFFERENTIAL/PLATELET
Abs Immature Granulocytes: 0.03 K/uL (ref 0.00–0.07)
Basophils Absolute: 0 K/uL (ref 0.0–0.1)
Basophils Relative: 0 %
Eosinophils Absolute: 0 K/uL (ref 0.0–0.5)
Eosinophils Relative: 0 %
HCT: 40.6 % (ref 39.0–52.0)
Hemoglobin: 14.1 g/dL (ref 13.0–17.0)
Immature Granulocytes: 0 %
Lymphocytes Relative: 10 %
Lymphs Abs: 0.7 K/uL (ref 0.7–4.0)
MCH: 31.1 pg (ref 26.0–34.0)
MCHC: 34.7 g/dL (ref 30.0–36.0)
MCV: 89.4 fL (ref 80.0–100.0)
Monocytes Absolute: 0.6 K/uL (ref 0.1–1.0)
Monocytes Relative: 8 %
Neutro Abs: 5.6 K/uL (ref 1.7–7.7)
Neutrophils Relative %: 82 %
Platelets: 228 K/uL (ref 150–400)
RBC: 4.54 MIL/uL (ref 4.22–5.81)
RDW: 12.9 % (ref 11.5–15.5)
WBC: 7 K/uL (ref 4.0–10.5)
nRBC: 0 % (ref 0.0–0.2)

## 2024-07-21 LAB — COMPREHENSIVE METABOLIC PANEL WITH GFR
ALT: 29 U/L (ref 0–44)
AST: 30 U/L (ref 15–41)
Albumin: 4 g/dL (ref 3.5–5.0)
Alkaline Phosphatase: 42 U/L (ref 38–126)
Anion gap: 11 (ref 5–15)
BUN: 7 mg/dL (ref 6–20)
CO2: 24 mmol/L (ref 22–32)
Calcium: 9.1 mg/dL (ref 8.9–10.3)
Chloride: 99 mmol/L (ref 98–111)
Creatinine, Ser: 1 mg/dL (ref 0.61–1.24)
GFR, Estimated: >60 mL/min (ref >60)
Glucose, Bld: 115 mg/dL — ABNORMAL HIGH (ref 70–99)
Potassium: 4 mmol/L (ref 3.5–5.1)
Sodium: 134 mmol/L — ABNORMAL LOW (ref 135–145)
Total Bilirubin: 0.9 mg/dL (ref 0.0–1.2)
Total Protein: 7.1 g/dL (ref 6.5–8.1)

## 2024-07-21 LAB — POCT URINE DRUG SCREEN - MANUAL ENTRY (I-SCREEN)
POC Amphetamine UR: NOT DETECTED
POC Buprenorphine (BUP): NOT DETECTED
POC Cocaine UR: POSITIVE — AB
POC Marijuana UR: NOT DETECTED
POC Methadone UR: POSITIVE — AB
POC Methamphetamine UR: POSITIVE — AB
POC Morphine: NOT DETECTED
POC Oxazepam (BZO): NOT DETECTED
POC Oxycodone UR: NOT DETECTED
POC Secobarbital (BAR): NOT DETECTED

## 2024-07-21 LAB — URINALYSIS, ROUTINE W REFLEX MICROSCOPIC
Bacteria, UA: NONE SEEN
Bilirubin Urine: NEGATIVE
Glucose, UA: NEGATIVE mg/dL
Hgb urine dipstick: NEGATIVE
Ketones, ur: NEGATIVE mg/dL
Nitrite: NEGATIVE
Protein, ur: NEGATIVE mg/dL
Specific Gravity, Urine: 1.019 (ref 1.005–1.030)
pH: 7 (ref 5.0–8.0)

## 2024-07-21 LAB — LIPID PANEL
Cholesterol: 166 mg/dL (ref 0–200)
HDL: 49 mg/dL (ref >40)
LDL Cholesterol: 108 mg/dL — ABNORMAL HIGH (ref 0–99)
Total CHOL/HDL Ratio: 3.4 ratio
Triglycerides: 43 mg/dL (ref <150)
VLDL: 9 mg/dL (ref 0–40)

## 2024-07-21 LAB — TSH: TSH: 1.03 u[IU]/mL (ref 0.350–4.500)

## 2024-07-21 LAB — HEMOGLOBIN A1C
Hgb A1c MFr Bld: 5 % (ref 4.8–5.6)
Mean Plasma Glucose: 96.8 mg/dL

## 2024-07-21 LAB — MAGNESIUM: Magnesium: 1.9 mg/dL (ref 1.7–2.4)

## 2024-07-21 LAB — ETHANOL: Alcohol, Ethyl (B): <15 mg/dL (ref <15)

## 2024-07-21 LAB — HIV ANTIBODY (ROUTINE TESTING W REFLEX): HIV Screen 4th Generation wRfx: NONREACTIVE

## 2024-07-21 MED ORDER — ACETAMINOPHEN 325 MG PO TABS: 650.0000 mg | ORAL_TABLET | Freq: Four times a day (QID) | ORAL | Status: DC | PRN

## 2024-07-21 MED ORDER — METHOCARBAMOL 500 MG PO TABS
500.0000 mg | ORAL_TABLET | Freq: Three times a day (TID) | ORAL | Status: DC | PRN
  Administered 2024-07-21: 500 mg via ORAL
  Filled 2024-07-21: qty 1

## 2024-07-21 MED ORDER — LORAZEPAM 2 MG/ML IJ SOLN: 2.0000 mg | Freq: Three times a day (TID) | INTRAMUSCULAR | Status: DC | PRN

## 2024-07-21 MED ORDER — CLONIDINE HCL 0.1 MG PO TABS: 0.1000 mg | ORAL_TABLET | Freq: Every day | ORAL | Status: DC

## 2024-07-21 MED ORDER — DIPHENHYDRAMINE HCL 50 MG/ML IJ SOLN: 50.0000 mg | Freq: Three times a day (TID) | INTRAMUSCULAR | Status: DC | PRN

## 2024-07-21 MED ORDER — CLONIDINE HCL 0.1 MG PO TABS
0.1000 mg | ORAL_TABLET | Freq: Four times a day (QID) | ORAL | Status: DC
  Administered 2024-07-21 (×2): 0.1 mg via ORAL
  Filled 2024-07-21 (×2): qty 1

## 2024-07-21 MED ORDER — TRAZODONE HCL 50 MG PO TABS
50.0000 mg | ORAL_TABLET | Freq: Every evening | ORAL | Status: DC | PRN
  Administered 2024-07-21: 50 mg via ORAL
  Filled 2024-07-21: qty 1

## 2024-07-21 MED ORDER — ONDANSETRON 4 MG PO TBDP
4.0000 mg | ORAL_TABLET | Freq: Four times a day (QID) | ORAL | Status: DC | PRN
  Administered 2024-07-21: 4 mg via ORAL
  Filled 2024-07-21: qty 1

## 2024-07-21 MED ORDER — MAGNESIUM HYDROXIDE 400 MG/5ML PO SUSP: 30.0000 mL | Freq: Every day | ORAL | Status: DC | PRN

## 2024-07-21 MED ORDER — DICYCLOMINE HCL 20 MG PO TABS: 20.0000 mg | ORAL_TABLET | Freq: Four times a day (QID) | ORAL | Status: DC | PRN

## 2024-07-21 MED ORDER — HALOPERIDOL LACTATE 5 MG/ML IJ SOLN: 10.0000 mg | Freq: Three times a day (TID) | INTRAMUSCULAR | Status: DC | PRN

## 2024-07-21 MED ORDER — HYDROXYZINE HCL 25 MG PO TABS
25.0000 mg | ORAL_TABLET | Freq: Four times a day (QID) | ORAL | Status: DC | PRN
  Administered 2024-07-21: 25 mg via ORAL
  Filled 2024-07-21: qty 1

## 2024-07-21 MED ORDER — LOPERAMIDE HCL 2 MG PO CAPS: 2.0000 mg | ORAL_CAPSULE | ORAL | Status: DC | PRN

## 2024-07-21 MED ORDER — HALOPERIDOL LACTATE 5 MG/ML IJ SOLN: 5.0000 mg | Freq: Three times a day (TID) | INTRAMUSCULAR | Status: DC | PRN

## 2024-07-21 MED ORDER — OLANZAPINE 2.5 MG PO TABS
2.5000 mg | ORAL_TABLET | Freq: Every day | ORAL | Status: DC
  Administered 2024-07-21: 2.5 mg via ORAL
  Filled 2024-07-21: qty 1

## 2024-07-21 MED ORDER — HALOPERIDOL 5 MG PO TABS: 5.0000 mg | ORAL_TABLET | Freq: Three times a day (TID) | ORAL | Status: DC | PRN

## 2024-07-21 MED ORDER — CLONIDINE HCL 0.1 MG PO TABS: 0.1000 mg | ORAL_TABLET | ORAL | Status: DC

## 2024-07-21 MED ORDER — DIPHENHYDRAMINE HCL 50 MG PO CAPS: 50.0000 mg | ORAL_CAPSULE | Freq: Three times a day (TID) | ORAL | Status: DC | PRN

## 2024-07-21 MED ORDER — ALUM & MAG HYDROXIDE-SIMETH 200-200-20 MG/5ML PO SUSP: 30.0000 mL | ORAL | Status: DC | PRN

## 2024-07-21 MED ORDER — NAPROXEN 500 MG PO TABS: 500.0000 mg | ORAL_TABLET | Freq: Two times a day (BID) | ORAL | Status: DC | PRN

## 2024-07-21 NOTE — ED Notes (Signed)
 Pt entered hallway accusing other two patients of killing his brother. Began to raise his voice and threaten the pts in the other assessments rooms. Pt kicked the door of room 135 after the pt in that room had closed the door. Upon the arrival of security, this tech was able to deescalate the PT and he sat back in him room. Security then closed the hallway doors to close off that room and pt.
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
## 2024-07-21 NOTE — Progress Notes (Signed)
   07/21/24 1444  [facility] Triage Screening (Walk-ins at [facility] only)  How Did You Hear About Us ? Legal System  What Is the Reason for Your Visit/Call Today? Billy Duke is a 33 year old male presenting to [facility] escorted by [employer]. Pt states that he endorses hallucinations at this time. Pt states that these voices are unfair to him. Pt reports that he sometimes will have thoughts of standing infront of a car to hurt himself. However, pt states this has not been for sometime. Pt reports that he used meth is morning and uses on occasions. Pt denies alcohol use, Si, Hi at this time. Pt also has a hx of short term memory, per [employer].  How Long Has This Been Causing You Problems? <Week  Have You Recently Had Any Thoughts About Hurting Yourself? No  Are You Planning to Commit Suicide/Harm Yourself At This time? No  Have you Recently Had Thoughts About Hurting Someone Sherral? No  Are You Planning To Harm Someone At This Time? No  Self-Neglect Denies  Possible abuse reported to: Other (Comment)  Are you currently experiencing any auditory, visual or other hallucinations? Yes  Have You Used Any Alcohol or Drugs in the Past 24 Hours? Yes  What Did You Use and How Much? meth, unsure of amount  Do you have any current medical co-morbidities that require immediate attention? No  What Do You Feel Would Help You the Most Today? Medication(s);Alcohol or Drug Use Treatment  If access to [facility] was not available, would you have sought care in the Emergency Department? No  Determination of Need Urgent (48 hours)  Options For Referral Facility-Based Crisis  Determination of Need filed? Yes

## 2024-07-21 NOTE — ED Notes (Signed)
 Pt pleasant, engaged remains med focused on obtaining methadone . Denies SI/HI/AVH. No s/s of withdrawal noted. No noted distress. Requesting snacks. Environmental check completed for safety

## 2024-07-21 NOTE — Progress Notes (Signed)
 Pt is admitted to [facility] due substance abuse, SI and auditory hallucinations. Pt currently denies and verbally contracts for safety on the unit. Pt was advised to notify staff Pt is alert and oriented X4. Pt is ambulatory and is oriented to staff/unit. Pt was cooperative with labs and skin assessment. Rash was noted on pt's bilateral arms. Pt reported being bitten by ants. Pt denies current HI/AVH. Staff will monitor for pt's safety.

## 2024-07-22 ENCOUNTER — Other Ambulatory Visit (HOSPITAL_COMMUNITY): Admission: EM | Admit: 2024-07-22 | Discharge: 2024-07-24 | Payer: PRIVATE HEALTH INSURANCE | Source: Intra-hospital

## 2024-07-22 DIAGNOSIS — F1490 Cocaine use, unspecified, uncomplicated: Secondary | ICD-10-CM | POA: Diagnosis not present

## 2024-07-22 DIAGNOSIS — F1120 Opioid dependence, uncomplicated: Secondary | ICD-10-CM

## 2024-07-22 DIAGNOSIS — F1114 Opioid abuse with opioid-induced mood disorder: Secondary | ICD-10-CM | POA: Diagnosis not present

## 2024-07-22 DIAGNOSIS — F1994 Other psychoactive substance use, unspecified with psychoactive substance-induced mood disorder: Secondary | ICD-10-CM | POA: Diagnosis not present

## 2024-07-22 DIAGNOSIS — F1914 Other psychoactive substance abuse with psychoactive substance-induced mood disorder: Secondary | ICD-10-CM | POA: Diagnosis not present

## 2024-07-22 DIAGNOSIS — F1510 Other stimulant abuse, uncomplicated: Secondary | ICD-10-CM

## 2024-07-22 LAB — HEPATITIS PANEL, ACUTE
HCV Ab: REACTIVE — AB
Hep A IgM: NONREACTIVE
Hep B C IgM: NONREACTIVE
Hepatitis B Surface Ag: NONREACTIVE

## 2024-07-22 LAB — RPR: RPR Ser Ql: NONREACTIVE

## 2024-07-22 MED ORDER — METHADONE HCL 10 MG PO TABS
140.0000 mg | ORAL_TABLET | Freq: Every day | ORAL | Status: DC
  Administered 2024-07-23: 140 mg via ORAL
  Filled 2024-07-22: qty 14

## 2024-07-22 MED ORDER — ALUM & MAG HYDROXIDE-SIMETH 200-200-20 MG/5ML PO SUSP: 30.0000 mL | ORAL | Status: DC | PRN

## 2024-07-22 MED ORDER — METHOCARBAMOL 500 MG PO TABS
500.0000 mg | ORAL_TABLET | Freq: Three times a day (TID) | ORAL | Status: DC | PRN
  Administered 2024-07-23: 500 mg via ORAL
  Filled 2024-07-22: qty 1

## 2024-07-22 MED ORDER — OLANZAPINE 2.5 MG PO TABS
2.5000 mg | ORAL_TABLET | Freq: Every day | ORAL | Status: DC
  Administered 2024-07-22: 2.5 mg via ORAL
  Filled 2024-07-22: qty 1

## 2024-07-22 MED ORDER — DIPHENHYDRAMINE HCL 50 MG/ML IJ SOLN: 50.0000 mg | Freq: Three times a day (TID) | INTRAMUSCULAR | Status: DC | PRN

## 2024-07-22 MED ORDER — ONDANSETRON 4 MG PO TBDP: 4.0000 mg | ORAL_TABLET | Freq: Four times a day (QID) | ORAL | Status: DC | PRN

## 2024-07-22 MED ORDER — HYDROXYZINE HCL 25 MG PO TABS: 25.0000 mg | ORAL_TABLET | Freq: Four times a day (QID) | ORAL | Status: DC | PRN

## 2024-07-22 MED ORDER — HALOPERIDOL 5 MG PO TABS: 5.0000 mg | ORAL_TABLET | Freq: Three times a day (TID) | ORAL | Status: DC | PRN

## 2024-07-22 MED ORDER — HALOPERIDOL LACTATE 5 MG/ML IJ SOLN: 5.0000 mg | Freq: Three times a day (TID) | INTRAMUSCULAR | Status: DC | PRN

## 2024-07-22 MED ORDER — DIPHENHYDRAMINE HCL 50 MG PO CAPS: 50.0000 mg | ORAL_CAPSULE | Freq: Three times a day (TID) | ORAL | Status: DC | PRN

## 2024-07-22 MED ORDER — TRAZODONE HCL 50 MG PO TABS
50.0000 mg | ORAL_TABLET | Freq: Every evening | ORAL | Status: DC | PRN
  Administered 2024-07-23: 50 mg via ORAL
  Filled 2024-07-22 (×2): qty 1

## 2024-07-22 MED ORDER — METHADONE HCL 10 MG PO TABS
150.0000 mg | ORAL_TABLET | Freq: Every day | ORAL | Status: DC
  Administered 2024-07-22: 150 mg via ORAL
  Filled 2024-07-22: qty 15

## 2024-07-22 MED ORDER — HALOPERIDOL LACTATE 5 MG/ML IJ SOLN: 10.0000 mg | Freq: Three times a day (TID) | INTRAMUSCULAR | Status: DC | PRN

## 2024-07-22 MED ORDER — CLONIDINE HCL 0.1 MG PO TABS: 0.1000 mg | ORAL_TABLET | ORAL | Status: DC

## 2024-07-22 MED ORDER — CLONIDINE HCL 0.1 MG PO TABS
0.1000 mg | ORAL_TABLET | Freq: Four times a day (QID) | ORAL | Status: DC
  Administered 2024-07-22 – 2024-07-23 (×6): 0.1 mg via ORAL
  Filled 2024-07-22 (×8): qty 1

## 2024-07-22 MED ORDER — LOPERAMIDE HCL 2 MG PO CAPS: 2.0000 mg | ORAL_CAPSULE | ORAL | Status: DC | PRN

## 2024-07-22 MED ORDER — LORAZEPAM 2 MG/ML IJ SOLN: 2.0000 mg | Freq: Three times a day (TID) | INTRAMUSCULAR | Status: DC | PRN

## 2024-07-22 MED ORDER — DICYCLOMINE HCL 20 MG PO TABS: 20.0000 mg | ORAL_TABLET | Freq: Four times a day (QID) | ORAL | Status: DC | PRN

## 2024-07-22 MED ORDER — NAPROXEN 500 MG PO TABS: 500.0000 mg | ORAL_TABLET | Freq: Two times a day (BID) | ORAL | Status: DC | PRN

## 2024-07-22 MED ORDER — MAGNESIUM HYDROXIDE 400 MG/5ML PO SUSP: 30.0000 mL | Freq: Every day | ORAL | Status: DC | PRN

## 2024-07-22 MED ORDER — CLONIDINE HCL 0.1 MG PO TABS: 0.1000 mg | ORAL_TABLET | Freq: Every day | ORAL | Status: DC

## 2024-07-22 NOTE — ED Notes (Signed)
 Pt sleeping at present, no distress noted.  Monitoring for safety.

## 2024-07-22 NOTE — Group Note (Signed)
 Group Topic: Wellness  Group Date: 07/22/2024 Start Time: 1030 End Time: 1100 Facilitators: Carletha Iha, RN  Department: [facility]  Number of Participants: 7  Group Focus: nursing group Medication Management Treatment Modality:  Psychoeducation Interventions utilized were patient education Purpose: increase insight  Name: Billy Duke Date of Birth: 12-18-1990  MR: [id number]    Level of Participation: did not attend Quality of Participation:  Interactions with others:  Mood/Affect:  Triggers (if applicable):  Cognition:  Progress:  Response:  Plan:   Patients Problems:  Patient Active Problem List   Diagnosis Date Noted   Substance induced mood disorder (HCC) 07/22/2024   MDD (major depressive disorder), recurrent, severe, with psychosis (HCC) 05/01/2024

## 2024-07-22 NOTE — ED Notes (Signed)
 Pt is sleeping at the moment, no acute distress noted. Q15 safety checks in place.

## 2024-07-22 NOTE — Care Management (Signed)
 [facility] Care Management  Writer met with the patient and discussed discharge planning.  Patient continues to requests inpatient substance abuse treatment.    Patient completed the phone intake screening with [facility].  The facility requested documentation that the patient psychosis was substance induced.  Writer faxed documentation to [phone].  The facility will call back after their team reviews the documentation to give an acceptance date and time for the patient to come to their facility.   Writer informed the patient, MD, NP and RN of the progress towards placement.

## 2024-07-22 NOTE — ED Provider Notes (Signed)
 Patient completed the PHQ-9 questionnaire and obtained a total score of 13, indicating moderate depression.

## 2024-07-22 NOTE — ED Notes (Signed)
 Pt observed lying in bed. Eyes closed respirations even and non labored. NAD q 15 minute observations continue for safety.

## 2024-07-22 NOTE — ED Provider Notes (Signed)
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 tablet (50 mg total) by mouth at bedtime as needed  for sleep. (Patient not taking: Reported on 07/22/2024)    Long Term Goals: Improvement in symptoms so as ready for discharge  Short Term Goals: Patient will verbalize feelings in meetings with treatment team members., Pt will complete the PHQ9 on admission, day 3 and discharge., and Patient will take medications as prescribed daily.  Medical Decision Making  33 year old male with a personal history of starting to use substances alcohol at age 60 and then pain meds in 17s and psych hx of hospitalization for psychosis related to drug us  and a family history of mental illness presents with hallucinations and paranoia related to substance use. He wants to become clean and sober. He has 6 previous treatments and 4 months is his longest period of sobriety. He started methadone  2 months ago because he thought it would help he stay away from drugs but it has not. He is homeless but sometimes stays with his ex-girlfriend.    Admit to Obs with a plan to go to [facility]   Labs Lab Results        CBC with Differential/Platelet   wnl      Comprehensive metabolic panel   sodium slighlty low 134      Hemoglobin A1c   5.0      Ethanol   <15      Lipid panel   LDL 108       TSH   1.030      POCT Urine Drug Screen - cocaine, meth and methadone  EKG WNL Hep C positive   Methadone  Pt wants to taper Monitor COWs Reduce Methadone  from 150 mg to 140 mg daily. Reduce dose by 10 mg per week is what it looks like is the recommendation.    Medication regimen Prns for agitation Zyprexa 2.5 mg qhs Trazodone  50 mg po at bedtime for sleep  Unit protocol of safety maintenance and regular checks Normal unit milieu and safety checks Groups that provide education and support for mental health education and abstinence promotion.   Discharge Planning: Social work and case management to assist with discharge planning and identification of hospital follow-up needs prior to discharge Estimated LOS: 3-4 days Discharge  Concerns: Need to establish a safety plan; Medication compliance and effectiveness Discharge Goals: Return home with outpatient referrals for mental health follow-up including medication management/psychotherapy   Patient did the interview for [facility]  Recommendations  Based on my evaluation the patient does not appear to have an emergency medical condition.  Garvin JINNY Gaines, MD 07/22/24  3:54 PM

## 2024-07-22 NOTE — ED Notes (Signed)
 Patient is a 33 y.o male admitted to [facility] for polysubstance use. reports that he has visions and hearing stuff and feeling that people are wanting to kill him and therefore he wants to walk into traffic. He has these hallucinations only when high. His mood is depressed and lonely. He is feeling like someone wants to hurt him. Thinking about jumping off bridges. He started Methadone  clinic 2 months ago because he thought it would help him to get off of drugs but he is still using meth, crack, and fentanyl. He is hopeless and worthless.Skin check assessment completed, no acute S/S of opioid withdrawal noted. Will continue to monitor.

## 2024-07-22 NOTE — ED Notes (Signed)
 Pt presented lying in bed.  Calm upon approach,  Denied current SI plan and intent.  Denied HI.  Pt stated,  I don't know why I have these illusions of demons and I don't know why I thought my brother got killed.  Pt also reported recent Methamphetamine use.  Educated patient regard effect of using methamphetamines. Pt initially said he would like to to to rehab. Pt also stated that his birthday is tomorrow and he doesn't even know if he wants to be here.  Reported plans of staying with the mother of his child this weekend for a visit. Q 15 minute observations for safety continue

## 2024-07-22 NOTE — Group Note (Signed)
 Group Topic: Social Support  Group Date: 07/22/2024 Start Time: 2000 End Time: 2030 Facilitators: Joan Plowman B  Department: [facility]  Number of Participants: 13  Group Focus: affirmation, communication, and social skills Treatment Modality:  Individual Therapy Interventions utilized were support Purpose: express feelings  Name: AMEAR STROJNY Date of Birth: April 01, 1991  MR: [id number]    Level of Participation: minimal Quality of Participation: attentive Interactions with others: gave feedback Mood/Affect: appropriate Triggers (if applicable): NA Cognition: coherent/clear Progress: None Response: Pt was irritated because he said he's hungry.  Plan: patient will be encouraged to keep going to groups  Patients Problems:  Patient Active Problem List   Diagnosis Date Noted   Substance induced mood disorder (HCC) 07/22/2024   MDD (major depressive disorder), recurrent, severe, with psychosis (HCC) 05/01/2024

## 2024-07-22 NOTE — Progress Notes (Signed)
 Verified Methdone dose with Luke at [facility]. Mehhadone 150 mg, LD 10/1.  Thanks, Landy Almarie SAUNDERS 07/22/2024 8:02 AM

## 2024-07-22 NOTE — Group Note (Signed)
 Group Topic: Decisional Balance/Substance Abuse  Group Date: 07/22/2024 Start Time: 1225 End Time: 1330 Facilitators: Lonzell Dwayne RAMAN, NT MHT 2 Department: [facility]  Number of Participants: 3  Group Focus: abuse issues Treatment Modality:  Patient-Centered Therapy Interventions utilized were reality testing Purpose: relapse prevention strategies  Name: Billy Duke Date of Birth: 03/19/1991  MR: [id number]    Level of Participation: Patient did not attend group Quality of Participation: N/A Interactions with others: N/A Mood/Affect: N/A Triggers (if applicable): N/A Cognition: N/A Progress: N/A Response: N/A Plan: N/A  Patients Problems:  Patient Active Problem List   Diagnosis Date Noted   Substance induced mood disorder (HCC) 07/22/2024   MDD (major depressive disorder), recurrent, severe, with psychosis (HCC) 05/01/2024

## 2024-07-23 DIAGNOSIS — F1114 Opioid abuse with opioid-induced mood disorder: Secondary | ICD-10-CM

## 2024-07-23 LAB — GC/CHLAMYDIA PROBE AMP (~~LOC~~) NOT AT ARMC
Chlamydia: NEGATIVE
Comment: NEGATIVE
Comment: NORMAL
Neisseria Gonorrhea: NEGATIVE

## 2024-07-23 LAB — PROLACTIN: Prolactin: 13.8 ng/mL (ref 3.9–22.7)

## 2024-07-23 MED ORDER — FLUOXETINE HCL 20 MG PO CAPS: 20.0000 mg | ORAL_CAPSULE | Freq: Every day | ORAL | 0 refills | Status: AC

## 2024-07-23 MED ORDER — CETIRIZINE HCL 10 MG PO TABS: 10.0000 mg | ORAL_TABLET | Freq: Every day | ORAL | Status: DC

## 2024-07-23 MED ORDER — FLUOXETINE HCL 10 MG PO CAPS
10.0000 mg | ORAL_CAPSULE | Freq: Every day | ORAL | Status: DC
Start: 2024-07-23 — End: 2024-07-24
  Administered 2024-07-23: 10 mg via ORAL
  Filled 2024-07-23: qty 1

## 2024-07-23 MED ORDER — TRAZODONE HCL 50 MG PO TABS: 50.0000 mg | ORAL_TABLET | Freq: Every evening | ORAL | 0 refills | Status: AC | PRN

## 2024-07-23 MED ORDER — CETIRIZINE HCL 10 MG PO TABS: 10.0000 mg | ORAL_TABLET | Freq: Every day | ORAL | 0 refills | Status: AC

## 2024-07-23 MED ORDER — ARIPIPRAZOLE 10 MG PO TABS
10.0000 mg | ORAL_TABLET | Freq: Every day | ORAL | Status: DC
  Administered 2024-07-23: 10 mg via ORAL
  Filled 2024-07-23: qty 1

## 2024-07-23 MED ORDER — ARIPIPRAZOLE 10 MG PO TABS: 10.0000 mg | ORAL_TABLET | Freq: Every day | ORAL | 0 refills | Status: AC

## 2024-07-23 MED ORDER — HYDROXYZINE HCL 25 MG PO TABS: 25.0000 mg | ORAL_TABLET | Freq: Three times a day (TID) | ORAL | 0 refills | Status: AC | PRN

## 2024-07-23 MED ORDER — CETIRIZINE HCL 5 MG/5ML PO SOLN: 5.0000 mg | Freq: Every day | ORAL | Status: DC

## 2024-07-23 NOTE — Group Note (Signed)
 Group Topic: Communication  Group Date: 07/23/2024 Start Time: 0800 End Time: 0830 Facilitators: Judi Monico RAMAN, NT  Department: [facility]  Number of Participants: 11  Group Focus: community group Treatment Modality:  Psychoeducation Interventions utilized were clarification, orientation, and support Purpose: improve communication skills and increase insight  Name: RENAN DANESE Date of Birth: [birth year]  MR: [id number]    Level of Participation: minimal Quality of Participation: passive Interactions with others: gave feedback Mood/Affect: appropriate Triggers (if applicable): n/a Cognition: coherent/clear Progress: Minimal Response: Pt expressed understanding of unit expectations and expressed any needs. Plan: patient will be encouraged to attend future groups  Patients Problems:  Patient Active Problem List   Diagnosis Date Noted   Substance induced mood disorder (HCC) 07/22/2024   MDD (major depressive disorder), recurrent, severe, with psychosis (HCC) 05/01/2024

## 2024-07-23 NOTE — Discharge Instructions (Signed)
 Go to [facility] Continue prescribed medication Suggest Methadone  taper from 140 mg daily taper by 10 mg each week.

## 2024-07-23 NOTE — Group Note (Signed)
 Group Topic: Decisional Balance/Substance Abuse  Group Date: 07/23/2024 Start Time: 1230 End Time: 1310 Facilitators: Stanly Stabile, RN  Department: [facility]  Number of Participants: 10  Group Focus: chemical dependency issues Treatment Modality:  Psychoeducation Interventions utilized were clarification, exploration, leisure development, and patient education Purpose: explore maladaptive thinking, increase insight, and regain self-worth  Name: Billy Duke Date of Birth: 05-07-1991  MR: [id number]    Level of Participation: minimal Quality of Participation: attentive and cooperative Interactions with others: gave feedback Mood/Affect: appropriate Triggers (if applicable):   Cognition: coherent/clear Progress: Minimal Response:   Plan: follow-up needed  Patients Problems:  Patient Active Problem List   Diagnosis Date Noted   Substance induced mood disorder (HCC) 07/22/2024   MDD (major depressive disorder), recurrent, severe, with psychosis (HCC) 05/01/2024

## 2024-07-23 NOTE — ED Notes (Signed)
 Pt irritable this AM because he did not get his methadone  at 0500 as he does in the community.  Explained to patient regarding times medications were administered Denied current SI plan and intent Denied HI and A/V hallucinations Q 15 minute observations for safety continue

## 2024-07-23 NOTE — ED Notes (Signed)
 Pt is sleeping. Respirations are even and labored. Q15 safety checks in place.

## 2024-07-23 NOTE — Group Note (Signed)
 Group Topic: Communication  Group Date: 07/23/2024 Start Time: 2000 End Time: 2030 Facilitators: Joan Plowman B  Department: [facility]  Number of Participants: 10  Group Focus: daily focus and goals/reality orientation Treatment Modality:  Individual Therapy Interventions utilized were support Purpose: express feelings  Name: Billy Duke Date of Birth: 1991/08/01  MR: [id number]    Level of Participation: PT DID NOT ATTEND GROUPS Quality of Participation: attentive Interactions with others: gave feedback Mood/Affect: appropriate Triggers (if applicable): NA Cognition: coherent/clear Progress: None Response: NA Plan: patient will be encouraged to keep going to groups  Patients Problems:  Patient Active Problem List   Diagnosis Date Noted   Substance induced mood disorder (HCC) 07/22/2024   MDD (major depressive disorder), recurrent, severe, with psychosis (HCC) 05/01/2024

## 2024-07-23 NOTE — Group Note (Signed)
 Group Topic: Recovery Basics  Group Date: 07/23/2024 Start Time: 1000 End Time: 1030 Facilitators: Judi Monico RAMAN, NT  Department: [facility]  Number of Participants: 10  Group Focus: other Peer Support Treatment Modality:  Psychoeducation Interventions utilized were support Purpose: enhance coping skills, express feelings, express irrational fears, and increase insight  Name: MASATO PETTIE Date of Birth: [birth year]  MR: [id number]    Level of Participation: PT DID NOT ATTEND  Patients Problems:  Patient Active Problem List   Diagnosis Date Noted   Substance induced mood disorder (HCC) 07/22/2024   MDD (major depressive disorder), recurrent, severe, with psychosis (HCC) 05/01/2024

## 2024-07-23 NOTE — Care Management (Addendum)
 [facility] Care Management   Per [facility], the patient is still under review,.  The Medical Lead has to review and give consent or the patient to be admitted into the program   12pm  Accepted to [facility], per [place] on Saturday 07/24/2024.  The facility will send a [employer] for the patient.  The bus will leave at 8am.   Nursing will secure transportation through [employer].  Writer informed the MD and the RN working with the patient.
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
## 2024-07-23 NOTE — ED Notes (Signed)
 Patient is in the bedroom calm and composed. NAD.  Environment secured per policy. Will keep monitoring for safety.

## 2024-07-24 NOTE — ED Notes (Signed)
 Patient agitated this morning saying he came with a blue backpack that is not included in his belongings. On the belonging sheet, there was no blue backpack documented. Security called to assist. Awaiting on safe transport to transport him to the bus station.

## 2024-07-24 NOTE — ED Notes (Signed)
 Pt Asleep at this time. NAD

## 2024-07-24 NOTE — ED Notes (Signed)
 Patient being picked by safe transport to the bus station to go to [facility]. All assessments remains unchanged. Patient still agitated and complaining of missing cell phone and backpack which were not documented.

## 2024-11-06 ENCOUNTER — Emergency Department (HOSPITAL_COMMUNITY): Payer: MEDICAID

## 2024-11-06 ENCOUNTER — Emergency Department (HOSPITAL_COMMUNITY)
Admission: EM | Admit: 2024-11-06 | Discharge: 2024-11-06 | Disposition: A | Discharge status: Home / Self Care | Payer: MEDICAID | Attending: Emergency Medicine | Admitting: Emergency Medicine

## 2024-11-06 DIAGNOSIS — L03113 Cellulitis of right upper limb: Secondary | ICD-10-CM | POA: Diagnosis not present

## 2024-11-06 DIAGNOSIS — L0390 Cellulitis, unspecified: Secondary | ICD-10-CM

## 2024-11-06 DIAGNOSIS — R21 Rash and other nonspecific skin eruption: Secondary | ICD-10-CM

## 2024-11-06 LAB — CBC WITH DIFFERENTIAL/PLATELET
Abs Immature Granulocytes: 0.02 K/uL (ref 0.00–0.07)
Basophils Absolute: 0 K/uL (ref 0.0–0.1)
Basophils Relative: 0 %
Eosinophils Absolute: 0.2 K/uL (ref 0.0–0.5)
Eosinophils Relative: 2 %
HCT: 39.5 % (ref 39.0–52.0)
Hemoglobin: 13.6 g/dL (ref 13.0–17.0)
Immature Granulocytes: 0 %
Lymphocytes Relative: 23 %
Lymphs Abs: 1.9 K/uL (ref 0.7–4.0)
MCH: 31.3 pg (ref 26.0–34.0)
MCHC: 34.4 g/dL (ref 30.0–36.0)
MCV: 91 fL (ref 80.0–100.0)
Monocytes Absolute: 0.8 K/uL (ref 0.1–1.0)
Monocytes Relative: 10 %
Neutro Abs: 5.4 K/uL (ref 1.7–7.7)
Neutrophils Relative %: 65 %
Platelets: 210 K/uL (ref 150–400)
RBC: 4.34 MIL/uL (ref 4.22–5.81)
RDW: 13 % (ref 11.5–15.5)
WBC: 8.4 K/uL (ref 4.0–10.5)
nRBC: 0 % (ref 0.0–0.2)

## 2024-11-06 LAB — BASIC METABOLIC PANEL WITH GFR
Anion gap: 10 (ref 5–15)
BUN: 13 mg/dL (ref 6–20)
CO2: 25 mmol/L (ref 22–32)
Calcium: 8.9 mg/dL (ref 8.9–10.3)
Chloride: 101 mmol/L (ref 98–111)
Creatinine, Ser: 1.07 mg/dL (ref 0.61–1.24)
GFR, Estimated: >60 mL/min
Glucose, Bld: 107 mg/dL — ABNORMAL HIGH (ref 70–99)
Potassium: 4.1 mmol/L (ref 3.5–5.1)
Sodium: 136 mmol/L (ref 135–145)

## 2024-11-06 LAB — I-STAT CG4 LACTIC ACID, ED: Lactic Acid, Venous: 0.9 mmol/L (ref 0.5–1.9)

## 2024-11-06 MED ORDER — MORPHINE SULFATE (PF) 2 MG/ML IV SOLN
2.0000 mg | Freq: Once | INTRAVENOUS | Status: AC
  Administered 2024-11-06: 2 mg via INTRAVENOUS
  Filled 2024-11-06: qty 1

## 2024-11-06 MED ORDER — SULFAMETHOXAZOLE-TRIMETHOPRIM 800-160 MG PO TABS: 1.0000 | ORAL_TABLET | Freq: Two times a day (BID) | ORAL | 0 refills | Status: AC

## 2024-11-06 MED ORDER — SULFAMETHOXAZOLE-TRIMETHOPRIM 800-160 MG PO TABS
1.0000 | ORAL_TABLET | Freq: Once | ORAL | Status: AC
  Administered 2024-11-06: 1 via ORAL
  Filled 2024-11-06: qty 1

## 2024-11-06 MED ORDER — SODIUM CHLORIDE 0.9 % IV SOLN
2.0000 g | Freq: Once | INTRAVENOUS | Status: AC
  Administered 2024-11-06: 2 g via INTRAVENOUS
  Filled 2024-11-06: qty 20

## 2024-11-06 NOTE — ED Provider Notes (Signed)
 " [place] EMERGENCY DEPARTMENT AT [facility] Provider Note   CSN: [id number] Arrival date & time: 11/06/24  1653     Patient presents with: Rash and Hand Problem   Billy Duke is a 34 y.o. male who presents to the ED today with primary concern of a red swollen right hand, also has some swelling and bumps on the left hand, also notes similar lesions in the groin area.  States that he has severe pain related to this, noted on the right he had he had several small bumps, had picked at them and then woke up this morning with his right hand edematous, and with increasing erythema.  Lesions in the groin have been spreading, larger areas of erythema, denies any dysuria.  The lesion in the groin area has been persistent over the last 1 to 2 weeks whereas the lesions on the hand have been over the last 24 to 48 hours.    Rash      Prior to Admission medications  Medication Sig Start Date End Date Taking? Authorizing Provider  sulfamethoxazole -trimethoprim  (BACTRIM  DS) 800-160 MG tablet Take 1 tablet by mouth 2 (two) times daily for 7 days. 11/06/24 11/13/24 Yes Myriam Dorn BROCKS, PA  ARIPiprazole  (ABILIFY ) 10 MG tablet Take 1 tablet (10 mg total) by mouth daily. 07/23/24 08/22/24  Lawrnce Garvin PARAS, MD  cetirizine  (ZYRTEC  ALLERGY) 10 MG tablet Take 1 tablet (10 mg total) by mouth daily. 07/23/24 08/22/24  Lawrnce Garvin PARAS, MD  FLUoxetine  (PROZAC ) 20 MG capsule Take 1 capsule (20 mg total) by mouth daily. 07/23/24 08/22/24  Lawrnce Garvin PARAS, MD  methadone  (DOLOPHINE ) 10 MG/ML solution Take 150 mg by mouth daily.    [provider]  traZODone  (DESYREL ) 50 MG tablet Take 1 tablet (50 mg total) by mouth at bedtime as needed for sleep. 07/23/24   Lawrnce Garvin PARAS, MD    Allergies: Vicodin [hydrocodone -acetaminophen ]    Review of Systems  Genitourinary:  Positive for genital sores.  Skin:  Positive for color change and rash.  All other systems reviewed and are  negative.   Updated Vital Signs BP (!) 154/99 (BP Location: Right Arm)   Pulse (!) 101   Temp 98.8 F (37.1 C) (Oral)   Resp 18   Ht 5' 9 (1.753 m)   Wt 83.9 kg   SpO2 97%   BMI 27.32 kg/m   Physical Exam Vitals and nursing note reviewed.  Constitutional:      General: He is not in acute distress.    Appearance: Normal appearance.  HENT:     Head: Normocephalic and atraumatic.     Mouth/Throat:     Mouth: Mucous membranes are moist.     Pharynx: Oropharynx is clear.  Eyes:     Extraocular Movements: Extraocular movements intact.     Conjunctiva/sclera: Conjunctivae normal.     Pupils: Pupils are equal, round, and reactive to light.  Cardiovascular:     Rate and Rhythm: Normal rate and regular rhythm.     Pulses: Normal pulses.     Heart sounds: Normal heart sounds. No murmur heard.    No friction rub. No gallop.  Pulmonary:     Effort: Pulmonary effort is normal.     Breath sounds: Normal breath sounds.  Abdominal:     General: Abdomen is flat. Bowel sounds are normal.     Palpations: Abdomen is soft.  Genitourinary:    Pubic Area: Rash present.     Penis:  Normal.      Testes: Normal.     Comments: Erythematous papular rash noted across the groin area, no tenderness or swelling appreciated either testis. Musculoskeletal:        General: Normal range of motion.     Cervical back: Normal range of motion and neck supple.     Right lower leg: No edema.     Left lower leg: No edema.  Skin:    General: Skin is warm and dry.     Capillary Refill: Capillary refill takes less than 2 seconds.     Findings: Erythema and rash present. Rash is papular.     Comments: Erythematous papular rash noted to the dorsum of the left hand, as well as to the dorsum of the right hand with the right hand being profoundly edematous as well.  Further, on the groin area there is erythema and papular rash noted as well.  Neurological:     General: No focal deficit present.     Mental  Status: He is alert. Mental status is at baseline.  Psychiatric:        Mood and Affect: Mood normal.     (all labs ordered are listed, but only abnormal results are displayed) Labs Reviewed  BASIC METABOLIC PANEL WITH GFR - Abnormal; Notable for the following components:      Result Value   Glucose, Bld 107 (*)    All other components within normal limits  CULTURE, BLOOD (ROUTINE X 2)  CULTURE, BLOOD (ROUTINE X 2)  CBC WITH DIFFERENTIAL/PLATELET  I-STAT CG4 LACTIC ACID, ED  I-STAT CG4 LACTIC ACID, ED    EKG: None  Radiology: DG Hand Complete Right Result Date: 11/06/2024 EXAM: 3 OR MORE VIEW(S) XRAY OF THE HAND 11/06/2024 07:46:00 PM COMPARISON: 06/26/2014 CLINICAL HISTORY: cellulitis/swelling FINDINGS: BONES AND JOINTS: Cortical irregularity at the base of the fourth metacarpal likely remote posttraumatic in nature. Well corticated cortical irregularity favored to be chronic. No acute fracture. No malalignment. SOFT TISSUES: Dorsal soft tissue swelling. IMPRESSION: 1. Dorsal soft tissue swelling, possibly related to cellulitis. Electronically signed by: Oneil Devonshire MD 11/06/2024 07:50 PM EST RP Workstation: [id number]     Procedures   Medications Ordered in the ED  cefTRIAXone  (ROCEPHIN ) 2 g in sodium chloride  0.9 % 100 mL IVPB (2 g Intravenous New Bag/Given 11/06/24 1932)  morphine  (PF) 2 MG/ML injection 2 mg (2 mg Intravenous Given 11/06/24 1843)  sulfamethoxazole -trimethoprim  (BACTRIM  DS) 800-160 MG per tablet 1 tablet (1 tablet Oral Given 11/06/24 1924)                                    Medical Decision Making Amount and/or Complexity of Data Reviewed Labs: ordered. Radiology: ordered.  Risk Prescription drug management.   Medical Decision Making:   Billy Duke is a 34 y.o. male who presented to the ED today with right detailed above.     Complete initial physical exam performed, notably the patient  was alert and oriented and in no apparent distress.   Physical exam as noted.    Reviewed and confirmed nursing documentation for past medical history, family history, social history.    Initial Assessment:   With the patient's presentation of erythematous papular rash, most likely diagnosis is cellulitis.  Given multiple site infection, consider possible sepsis, hematogenous spread.  Consider possible allergic reaction however given the history of the rash as well as the appearance,  this does not seem consistent with allergic reaction as there is no urticaria, it is erythematous and papular, more consistent with possible streptococcal skin infection.  Initial Plan:  Obtain blood cultures and lactate secondary to possible sepsis. Screening labs including CBC and Metabolic panel to evaluate for infectious or metabolic etiology of disease.  Obtain x-ray of the right hand to evaluate for subcutaneous gas. Objective evaluation as below reviewed   Initial Study Results:   Laboratory  All laboratory results reviewed without evidence of clinically relevant pathology.   Exceptions include: None  Radiology:  All images reviewed independently. Agree with radiology report at this time.   DG Hand Complete Right Result Date: 11/06/2024 EXAM: 3 OR MORE VIEW(S) XRAY OF THE HAND 11/06/2024 07:46:00 PM COMPARISON: 06/26/2014 CLINICAL HISTORY: cellulitis/swelling FINDINGS: BONES AND JOINTS: Cortical irregularity at the base of the fourth metacarpal likely remote posttraumatic in nature. Well corticated cortical irregularity favored to be chronic. No acute fracture. No malalignment. SOFT TISSUES: Dorsal soft tissue swelling. IMPRESSION: 1. Dorsal soft tissue swelling, possibly related to cellulitis. Electronically signed by: Oneil Devonshire MD 11/06/2024 07:50 PM EST RP Workstation: [id number]    Reassessment and Plan:   Review of labs does not show any leukocytosis, no elevated lactate, normal kidney function.  X-ray did not demonstrate any subcutaneous gas, physical  exam does not show risk for abscess as there is no fluctuant mass of note appreciated to the hand.  Given this patient was started on IV ceftriaxone , and will discharge this patient with an outpatient prescription for Bactrim  and follow-up to the PCP.  This is discussed with the patient who verbalizes understanding and agreement has no further concerns at this time this will be discharged with outpatient follow-up.  Referral given for community clinic for PCP follow-up.       Final diagnoses:  Cellulitis, unspecified cellulitis site    ED Discharge Orders          Ordered    sulfamethoxazole -trimethoprim  (BACTRIM  DS) 800-160 MG tablet  2 times daily        11/06/24 1927               Myriam Dorn BROCKS, [place] 11/06/24 1953  "

## 2024-11-06 NOTE — ED Triage Notes (Signed)
 Pt reports rash on genitalia x 1-2 weeks that is painful and goes down legs  Pt also reports bilateral hand swelling x 1 day , denies injury, trauma or drug use to both site

## 2024-11-06 NOTE — Discharge Instructions (Addendum)
 As discussed, you can continue to use Tylenol  and ibuprofen as needed for pain control.  Otherwise continue take antibiotics as prescribed and follow-up with your primary care for further evaluation.  If this worsens despite care provided today, please return to the emergency room for reevaluation and further treatment.

## 2024-11-12 LAB — CULTURE, BLOOD (ROUTINE X 2)
Culture: NO GROWTH
Culture: NO GROWTH
Special Requests: ADEQUATE
